# Patient Record
Sex: Male | Born: 1965 | Race: Black or African American | Hispanic: No | Marital: Married | Smoking: Never smoker
Health system: Southern US, Community
[De-identification: ages and names within clinical notes are randomized; demographics above are authoritative.]

## PROBLEM LIST (undated history)

## (undated) DIAGNOSIS — Z5189 Encounter for other specified aftercare: Secondary | ICD-10-CM

## (undated) DIAGNOSIS — L309 Dermatitis, unspecified: Secondary | ICD-10-CM

## (undated) DIAGNOSIS — T7840XA Allergy, unspecified, initial encounter: Secondary | ICD-10-CM

## (undated) DIAGNOSIS — IMO0002 Reserved for concepts with insufficient information to code with codable children: Secondary | ICD-10-CM

## (undated) DIAGNOSIS — R569 Unspecified convulsions: Secondary | ICD-10-CM

## (undated) DIAGNOSIS — E785 Hyperlipidemia, unspecified: Secondary | ICD-10-CM

## (undated) HISTORY — DX: Allergy, unspecified, initial encounter: T78.40XA

## (undated) HISTORY — DX: Reserved for concepts with insufficient information to code with codable children: IMO0002

## (undated) HISTORY — DX: Encounter for other specified aftercare: Z51.89

## (undated) HISTORY — DX: Dermatitis, unspecified: L30.9

## (undated) HISTORY — PX: CARPAL TUNNEL RELEASE: SHX101

## (undated) HISTORY — DX: Unspecified convulsions: R56.9

## (undated) HISTORY — PX: FOOT SURGERY: SHX648

## (undated) HISTORY — DX: Hyperlipidemia, unspecified: E78.5

---

## 2004-08-10 ENCOUNTER — Encounter: Admission: RE | Admit: 2004-08-10 | Discharge: 2004-08-10 | Payer: Self-pay | Admitting: Orthopedic Surgery

## 2004-10-09 ENCOUNTER — Ambulatory Visit (HOSPITAL_BASED_OUTPATIENT_CLINIC_OR_DEPARTMENT_OTHER): Admission: RE | Admit: 2004-10-09 | Discharge: 2004-10-09 | Payer: Self-pay | Admitting: Orthopedic Surgery

## 2004-10-09 ENCOUNTER — Ambulatory Visit (HOSPITAL_COMMUNITY): Admission: RE | Admit: 2004-10-09 | Discharge: 2004-10-09 | Payer: Self-pay | Admitting: Orthopedic Surgery

## 2006-11-26 ENCOUNTER — Encounter
Admission: RE | Admit: 2006-11-26 | Discharge: 2006-11-26 | Payer: Self-pay | Admitting: Physical Medicine and Rehabilitation

## 2010-04-02 ENCOUNTER — Encounter: Payer: Self-pay | Admitting: Physical Medicine and Rehabilitation

## 2010-07-27 NOTE — Op Note (Signed)
Nathan Boyle, Nathan Boyle             ACCOUNT NO.:  [id number]   MEDICAL RECORD NO.:  [id number]          PATIENT TYPE:  AMB   LOCATION:  [facility]SC                          FACILITY:  [facility]   PHYSICIAN:  Katy Fitch. Sypher, M.D. DATE OF BIRTH:  [birth year]   DATE OF PROCEDURE:  10/09/2004  DATE OF DISCHARGE:                                 OPERATIVE REPORT   PREOPERATIVE DIAGNOSIS:  Internal derangement of right wrist with  preoperative MRI suggesting scapholunate interosseous ligament tear and  clinical examination revealing instability of sixth dorsal compartment and  ulnar-sided wrist pain consistent with a peripheral triangular  fibrocartilage tear.   POSTOPERATIVE DIAGNOSIS:  Large ulnar and dorsal peripheral triangular  fibrocartilage tear with instability of triangular fibrocartilage and sixth  dorsal compartment and identification of the intact, but patulous  scapholunate interosseous ligament.   OPERATION:  1.  Examination of right wrist under anesthesia demonstrating a negative      Watson's maneuver and no sign of scapholunate dissociative instability.  2.  Diagnostic arthroscopy, right wrist, revealing a large peripheral      triangular fibrocartilage tear which is probably the genesis of the      sixth dorsal compartment instability and incidental synovitis and      fragments of cartilage noted impinging in the dorsal ulnocarpal joint      which were subsequently debrided.   OPERATING SURGEON:  Josephine Igo, M.D.   ASSISTANT:  Molly Maduro Dasnoit PA-C.   ANESTHESIA:  General by LMA.   SUPERVISING ANESTHESIOLOGIST:  Janetta Hora. Gelene Mink, M.D.   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 Gean Birchwood for evaluation and management of chronic ulnar-  sided right wrist pain.   Nathan Boyle had sustained an injury to his wrist while lifting a heavy tire  in the spring of 2006.  He noted a popping and grinding sensation when he  would  flex and extend his wrist in ulnar deviation or during episodes of  pronation and supination.  He sought a consultation with Dr. Turner Daniels and was  thought to have internal derangement.  He was referred for an MRI, which was  completed at the [facility] on August 10, 2004.  This was  interpreted by Dr. Maricela Curet to reveal a complete avulsion of the  scapholunate interosseous ligament.   Dr. Turner Daniels was kind enough to refer Nathan Boyle for an extremity orthopedic  consult.   On clinical examination in August 29, 2004, Nathan Boyle did not demonstrate  signs of dissociative carpal instability, specifically he had a negative  Watson's maneuver.  He did, however, have signs of instability of his sixth  dorsal compartment and ulnar-sided pain consistent with a peripheral  triangular fibrocartilage tear.   We recommended diagnostic arthroscopy, anticipating possible debridement of  his scapholunate interosseous ligament and possible stabilization of the  sixth dorsal compartment.   After informed consent, he is brought to the operating room at this time.   Preoperatively, he was advised that we could not repair scapholunate  interosseous ligaments and had limited abilities to repair the triangular  fibrocartilage.   PROCEDURE:  Nathan Boyle was brought to the operating room and placed in  a supine position upon the operating table.   Following anesthesia consultation by Dr. Gelene Mink, general anesthesia by  LMA technique was induced.   The right arm was prepped with Betadine soaping solution and sterilely  draped.  A pneumatic tourniquet was applied to the proximal brachium.  One  gram of Ancef was administered as an IV prophylactic antibiotic followed by  exsanguination of the right arm with an Esmarch bandage and inflation of the  arterial tourniquet to 240 mmHg due to mild systolic hypertension.   Procedure commenced with a careful Watson's maneuver examination of the  wrist.   There were absolutely no signs of dissociative instability of the  scapholunate interosseous ligament.   The wrist was then distracted by placement of the index and long fingers in  fingertraps and use of the traction tower designed for wrist arthroscopy to  apply countertraction on the forearm at 10 pounds of traction application.   The wrist was sounded with an 18-gauge needle, distended with sterile saline  and instrumented through the 3/4 dorsal portal with a blunt trocar.  Diagnostic arthroscopy revealed synovitis on the ulnar aspect of the wrist,  obscuring a large peripheral triangular fibrocartilage tear.  The prestyloid  recess was normal.  The lunotriquetral interosseous ligament was normal.  The hyaline and articular cartilage surfaces of the triquetrum, lunate and  scaphoid were normal.  The scapholunate interosseous ligament was intact,  but patulous.  The hyaline cartilage on the distal radius was normal and  scaphoid and lunate facets and t he volar radiocarpal and ulnocarpal  ligaments were intact.   It appeared that the genesis of his complaints was a peripheral triangular  fibrocartilage tear.   A suction shaver was placed through the 6R portal and used to debride the  synovitis and fragments of loose dorsal triangular fibrocartilage and dorsal  capsular tissues.   A Tuohy needle was used to place 2 mattress sutures of 2-0 FiberWire to  repair the peripheral triangular fibrocartilage.  A curvilinear incision was  fashioned to identify the dorsoulnar sensory branch and the sixth dorsal  compartment was carefully dissected to retract the dorsoulnar sensory branch  and to protect the neurovascular structures prior to tensioning the  triangular fibrocartilage repair.  It appeared that with tying the mattress  sutures with preparing the fibrocartilage, we also increased the tension in  the floor of the sixth dorsal compartment.  This should in part stabilize the sixth  dorsal compartment.   I elected not to perform retinacular repair.   The sutures were tightened under direct vision, substantially decreasing the  laxity of the dorsal radioulnar ligament.   The repair was documented with digital photography followed by removal of  the arthroscopic equipment.   The curvilinear incision used to expose the sixth dorsal compartment and  dorsoulnar sensory branch was closed with intradermal 3-0 Prolene and the  portals were closed with Steri-Strips. There were no apparent complications.   Nathan Boyle was maintained in supination during repair of the wounds and was  subsequently immobilized in a voluminous gauze dressing with dorsal and  palmar splints, maintaining the forearm in full supination and the elbow at  90 degrees of flexion.   There were no apparent complications.   He was transferred to the recovery room with stable signs.  He will be  discharged home with prescriptions for Dilaudid 2 mg one p.o. q.4-6  h.  p.r.n. pain, 30 tablets without refill, also Motrin 600 mg one p.o. q.6 h.  p.r.n. pain with food, 30 tablets with 1 refill, and Levaquin 500 mg one  p.o. daily x4 days as a prophylactic antibiotic.       RVS/MEDQ  D:  10/09/2004  T:  10/10/2004  Job:  [id number]

## 2013-03-01 ENCOUNTER — Encounter: Payer: Self-pay | Admitting: Family

## 2013-03-01 ENCOUNTER — Ambulatory Visit (INDEPENDENT_AMBULATORY_CARE_PROVIDER_SITE_OTHER): Payer: BC Managed Care – PPO | Admitting: Family

## 2013-03-01 VITALS — BP 126/78 | HR 83 | Ht 70.5 in | Wt 232.0 lb

## 2013-03-01 DIAGNOSIS — J309 Allergic rhinitis, unspecified: Secondary | ICD-10-CM

## 2013-03-01 DIAGNOSIS — J019 Acute sinusitis, unspecified: Secondary | ICD-10-CM

## 2013-03-01 MED ORDER — FLUTICASONE PROPIONATE 50 MCG/ACT NA SUSP: 2.0000 | Freq: Every day | NASAL | Status: DC

## 2013-03-01 MED ORDER — AZITHROMYCIN 250 MG PO TABS: ORAL_TABLET | ORAL | Status: DC

## 2013-03-01 NOTE — Progress Notes (Signed)
   Subjective:    Patient ID: Nathan Boyle, male    DOB: 1965-07-08, 47 y.o.   MRN: [id number]  HPI 47 year old African American male, nonsmoker is in today with complaints of sinus pressure, pain, and congestion x1 week now worsening. Has been taking TheraFlu, Benadryl, ibuprofen, and using an cough drops with no relief.    Review of Systems  Constitutional: Negative.   HENT: Positive for congestion, postnasal drip and sinus pressure.   Respiratory: Negative.   Cardiovascular: Negative.   Gastrointestinal: Negative.   Endocrine: Negative.   Genitourinary: Negative.   Musculoskeletal: Negative.   Skin: Negative.   Allergic/Immunologic: Negative.   Neurological: Negative.   Hematological: Negative.   Psychiatric/Behavioral: Negative.    Past Medical History  Diagnosis Date  . Seizures     at age 47  . Ulcer     30 yrs ago    History   Social History  . Marital Status: Married    Spouse Name: N/A    Number of Children: N/A  . Years of Education: N/A   Occupational History  . Not on file.   Social History Main Topics  . Smoking status: Never Smoker   . Smokeless tobacco: Not on file  . Alcohol Use: No  . Drug Use: No  . Sexual Activity: Not on file   Other Topics Concern  . Not on file   Social History Narrative  . No narrative on file    History reviewed. No pertinent past surgical history.  Family History  Problem Relation Age of Onset  . Hyperlipidemia Mother   . Stroke Mother   . Arthritis Sister   . Stroke Maternal Grandmother     Allergies  Allergen Reactions  . Penicillins     No current outpatient prescriptions on file prior to visit.   No current facility-administered medications on file prior to visit.    BP 126/78  Pulse 83  Ht 5' 10.5" (1.791 m)  Wt 232 lb (105.235 kg)  BMI 32.81 kg/m2chart    Objective:   Physical Exam  Constitutional: He is oriented to person, place, and time. He appears well-developed and  well-nourished.  HENT:  Right Ear: External ear normal.  Left Ear: External ear normal.  Nose: Nose normal.  Mouth/Throat: Oropharynx is clear and moist.  Neck: Normal range of motion. Neck supple.  Cardiovascular: Normal rate, regular rhythm and normal heart sounds.   Pulmonary/Chest: Effort normal and breath sounds normal.  Abdominal: Soft. Bowel sounds are normal.  Musculoskeletal: Normal range of motion.  Neurological: He is alert and oriented to person, place, and time.  Skin: Skin is warm and dry.  Psychiatric: He has a normal mood and affect.          Assessment & Plan:  Assessment: 1. Acute sinusitis 2. Allergic rhinitis  Plan: Flonase 2 sprays in his nausea once a day. Z-Pak as directed. Patient apply office if symptoms worsen or persist. Recheck as scheduled, and as needed.

## 2013-03-01 NOTE — Patient Instructions (Signed)

## 2013-07-05 ENCOUNTER — Other Ambulatory Visit: Payer: Self-pay

## 2013-07-05 MED ORDER — FEXOFENADINE-PSEUDOEPHED ER 60-120 MG PO TB12: 1.0000 | ORAL_TABLET | Freq: Two times a day (BID) | ORAL | Status: AC

## 2013-10-25 ENCOUNTER — Telehealth: Payer: Self-pay | Admitting: Family

## 2013-10-25 ENCOUNTER — Encounter: Payer: Self-pay | Admitting: Family

## 2013-10-25 ENCOUNTER — Ambulatory Visit (INDEPENDENT_AMBULATORY_CARE_PROVIDER_SITE_OTHER): Payer: 59 | Admitting: Family

## 2013-10-25 VITALS — BP 140/94 | HR 74 | Temp 98.0°F | Ht 70.5 in | Wt 219.0 lb

## 2013-10-25 DIAGNOSIS — Z125 Encounter for screening for malignant neoplasm of prostate: Secondary | ICD-10-CM

## 2013-10-25 DIAGNOSIS — R209 Unspecified disturbances of skin sensation: Secondary | ICD-10-CM

## 2013-10-25 DIAGNOSIS — R5381 Other malaise: Secondary | ICD-10-CM

## 2013-10-25 DIAGNOSIS — Z0000 Encounter for general adult medical examination without abnormal findings: Secondary | ICD-10-CM

## 2013-10-25 DIAGNOSIS — R202 Paresthesia of skin: Secondary | ICD-10-CM

## 2013-10-25 DIAGNOSIS — R5383 Other fatigue: Secondary | ICD-10-CM

## 2013-10-25 LAB — COMPREHENSIVE METABOLIC PANEL
ALBUMIN: 4.3 g/dL (ref 3.5–5.2)
ALK PHOS: 71 U/L (ref 39–117)
ALT: 19 U/L (ref 0–53)
AST: 19 U/L (ref 0–37)
BILIRUBIN TOTAL: 1.4 mg/dL — AB (ref 0.2–1.2)
BUN: 10 mg/dL (ref 6–23)
CO2: 28 mEq/L (ref 19–32)
CREATININE: 0.9 mg/dL (ref 0.4–1.5)
Calcium: 9.6 mg/dL (ref 8.4–10.5)
Chloride: 106 mEq/L (ref 96–112)
GFR: 112.11 mL/min (ref >60.00)
Glucose, Bld: 97 mg/dL (ref 70–99)
Potassium: 3.9 mEq/L (ref 3.5–5.1)
SODIUM: 141 meq/L (ref 135–145)
TOTAL PROTEIN: 7.2 g/dL (ref 6.0–8.3)

## 2013-10-25 LAB — CBC WITH DIFFERENTIAL/PLATELET
BASOS PCT: 0.5 % (ref 0.0–3.0)
Basophils Absolute: 0 10*3/uL (ref 0.0–0.1)
EOS ABS: 0.1 10*3/uL (ref 0.0–0.7)
EOS PCT: 1.8 % (ref 0.0–5.0)
HCT: 41 % (ref 39.0–52.0)
Hemoglobin: 13.5 g/dL (ref 13.0–17.0)
LYMPHS PCT: 34.6 % (ref 12.0–46.0)
Lymphs Abs: 1.4 10*3/uL (ref 0.7–4.0)
MCHC: 33 g/dL (ref 30.0–36.0)
MCV: 88.3 fl (ref 78.0–100.0)
MONO ABS: 0.5 10*3/uL (ref 0.1–1.0)
MONOS PCT: 11.1 % (ref 3.0–12.0)
NEUTROS ABS: 2.1 10*3/uL (ref 1.4–7.7)
Neutrophils Relative %: 52 % (ref 43.0–77.0)
PLATELETS: 186 10*3/uL (ref 150.0–400.0)
RBC: 4.64 Mil/uL (ref 4.22–5.81)
RDW: 12.3 % (ref 11.5–15.5)
WBC: 4.1 10*3/uL (ref 4.0–10.5)

## 2013-10-25 LAB — POCT URINALYSIS DIPSTICK
Bilirubin, UA: NEGATIVE
GLUCOSE UA: NEGATIVE
KETONES UA: NEGATIVE
Leukocytes, UA: NEGATIVE
NITRITE UA: NEGATIVE
PH UA: 6
Protein, UA: NEGATIVE
RBC UA: NEGATIVE
Spec Grav, UA: 1.01
UROBILINOGEN UA: 0.2

## 2013-10-25 LAB — LIPID PANEL
CHOL/HDL RATIO: 4
Cholesterol: 194 mg/dL (ref 0–200)
HDL: 46.8 mg/dL (ref >39.00)
LDL CALC: 123 mg/dL — AB (ref 0–99)
NonHDL: 147.2
TRIGLYCERIDES: 121 mg/dL (ref 0.0–149.0)
VLDL: 24.2 mg/dL (ref 0.0–40.0)

## 2013-10-25 LAB — PSA: PSA: 0.53 ng/mL (ref 0.10–4.00)

## 2013-10-25 LAB — TESTOSTERONE: TESTOSTERONE: 336.05 ng/dL (ref 300.00–890.00)

## 2013-10-25 LAB — TSH: TSH: 0.84 u[IU]/mL (ref 0.35–4.50)

## 2013-10-25 MED ORDER — TRIAMCINOLONE 0.1 % CREAM:EUCERIN CREAM 1:1
1.0000 | TOPICAL_CREAM | Freq: Two times a day (BID) | CUTANEOUS | Status: DC
Start: 2013-10-25 — End: 2015-02-06

## 2013-10-25 NOTE — Progress Notes (Signed)
Subjective:    Patient ID: Nathan Boyle, male    DOB: 11-Nov-1965, 48 y.o.   MRN: [id number]  HPI 48 year old AAM, nonsmoker, is in today with c/o fatigue, numbness and tingling in his fingers and toes x3-4 months. The symptoms tend to be worse with increased stress. Reports the symptoms come and go in. Denies any relief from anything in particular.   Review of Systems  Constitutional: Positive for fatigue.  HENT: Negative.   Respiratory: Negative.   Cardiovascular: Negative.   Gastrointestinal: Negative.   Endocrine: Negative.   Genitourinary: Negative.   Musculoskeletal: Negative.   Skin: Negative.   Allergic/Immunologic: Negative.   Neurological: Positive for numbness.       Numbness and tingling in fingers and toes.   Hematological: Negative.   Psychiatric/Behavioral: Negative.    Past Medical History  Diagnosis Date  . Seizures     at age 48  . Ulcer     30 yrs ago    History   Social History  . Marital Status: Married    Spouse Name: N/A    Number of Children: N/A  . Years of Education: N/A   Occupational History  . Not on file.   Social History Main Topics  . Smoking status: Never Smoker   . Smokeless tobacco: Not on file  . Alcohol Use: No  . Drug Use: No  . Sexual Activity: Not on file   Other Topics Concern  . Not on file   Social History Narrative  . No narrative on file    History reviewed. No pertinent past surgical history.  Family History  Problem Relation Age of Onset  . Hyperlipidemia Mother   . Stroke Mother   . Arthritis Sister   . Stroke Maternal Grandmother     Allergies  Allergen Reactions  . Penicillins     Current Outpatient Prescriptions on File Prior to Visit  Medication Sig Dispense Refill  . fexofenadine-pseudoephedrine (ALLEGRA-D 12 HOUR) 60-120 MG per tablet Take 1 tablet by mouth 2 (two) times daily.  180 tablet  0   No current facility-administered medications on file prior to visit.    BP 140/94   Pulse 74  Temp(Src) 98 F (36.7 C) (Oral)  Ht 5' 10.5" (1.791 m)  Wt 219 lb (99.338 kg)  BMI 30.97 kg/m2  SpO2 98%chart    Objective:   Physical Exam  Constitutional: He is oriented to person, place, and time. He appears well-developed and well-nourished.  HENT:  Right Ear: External ear normal.  Left Ear: External ear normal.  Nose: Nose normal.  Mouth/Throat: Oropharynx is clear and moist.  Neck: Normal range of motion. Neck supple.  Cardiovascular: Normal rate, regular rhythm and normal heart sounds.   Pulmonary/Chest: Effort normal and breath sounds normal.  Abdominal: Soft. Bowel sounds are normal.  Musculoskeletal: Normal range of motion.  Neurological: He is alert and oriented to person, place, and time.  Skin: Skin is warm and dry.  Psychiatric: He has a normal mood and affect.          Assessment & Plan:  Mandela was seen today for fatigue and tingling.  Diagnoses and associated orders for this visit:  Tingling in extremities - CMP - CBC with Differential - TSH - Vitamin D, 1,25-dihydroxy - PSA - POC Urinalysis Dipstick - Testosterone - Lipid Panel  Other malaise and fatigue - CMP - CBC with Differential - TSH - Vitamin D, 1,25-dihydroxy - PSA - POC Urinalysis Dipstick -  Testosterone - Lipid Panel  Screening PSA (prostate specific antigen) - CMP - CBC with Differential - TSH - Vitamin D, 1,25-dihydroxy - PSA - POC Urinalysis Dipstick - Testosterone - Lipid Panel - POC Urinalysis Dipstick  Preventative health care - CMP - CBC with Differential - TSH - Vitamin D, 1,25-dihydroxy - PSA - POC Urinalysis Dipstick - Testosterone - Lipid Panel - POC Urinalysis Dipstick  Other Orders - Triamcinolone Acetonide (TRIAMCINOLONE 0.1 % CREAM : EUCERIN) CREA; Apply 1 application topically 2 (two) times daily.   Call the office with any questions or concerns. Return for CPX. Will notify patient pending results.

## 2013-10-25 NOTE — Telephone Encounter (Signed)
Obtain Eucerin OTC and hydrocortisone cream. Mix together 1:1 at home.

## 2013-10-25 NOTE — Progress Notes (Signed)
Pre visit review using our clinic review tool, if applicable. No additional management support is needed unless otherwise documented below in the visit note. 

## 2013-10-25 NOTE — Telephone Encounter (Signed)
I called to submit PA for Triamcinolone Acetonide (TRIAMCINOLONE 0.1 % CREAM : EUCERIN) CREA and was advised the pt's plan does not cover Compound Creams.

## 2013-10-25 NOTE — Patient Instructions (Signed)
Eczema Eczema, also called atopic dermatitis, is a skin disorder that causes inflammation of the skin. It causes a red rash and dry, scaly skin. The skin becomes very itchy. Eczema is generally worse during the cooler winter months and often improves with the warmth of summer. Eczema usually starts showing signs in infancy. Some children outgrow eczema, but it may last through adulthood.  CAUSES  The exact cause of eczema is not known, but it appears to run in families. People with eczema often have a family history of eczema, allergies, asthma, or hay fever. Eczema is not contagious. Flare-ups of the condition may be caused by:   Contact with something you are sensitive or allergic to.   Stress. SIGNS AND SYMPTOMS  Dry, scaly skin.   Red, itchy rash.   Itchiness. This may occur before the skin rash and may be very intense.  DIAGNOSIS  The diagnosis of eczema is usually made based on symptoms and medical history. TREATMENT  Eczema cannot be cured, but symptoms usually can be controlled with treatment and other strategies. A treatment plan might include:  Controlling the itching and scratching.   Use over-the-counter antihistamines as directed for itching. This is especially useful at night when the itching tends to be worse.   Use over-the-counter steroid creams as directed for itching.   Avoid scratching. Scratching makes the rash and itching worse. It may also result in a skin infection (impetigo) due to a break in the skin caused by scratching.   Keeping the skin well moisturized with creams every day. This will seal in moisture and help prevent dryness. Lotions that contain alcohol and water should be avoided because they can dry the skin.   Limiting exposure to things that you are sensitive or allergic to (allergens).   Recognizing situations that cause stress.   Developing a plan to manage stress.  HOME CARE INSTRUCTIONS   Only take over-the-counter or  prescription medicines as directed by your health care provider.   Do not use anything on the skin without checking with your health care provider.   Keep baths or showers short (5 minutes) in warm (not hot) water. Use mild cleansers for bathing. These should be unscented. You may add nonperfumed bath oil to the bath water. It is best to avoid soap and bubble bath.   Immediately after a bath or shower, when the skin is still damp, apply a moisturizing ointment to the entire body. This ointment should be a petroleum ointment. This will seal in moisture and help prevent dryness. The thicker the ointment, the better. These should be unscented.   Keep fingernails cut short. Children with eczema may need to wear soft gloves or mittens at night after applying an ointment.   Dress in clothes made of cotton or cotton blends. Dress lightly, because heat increases itching.   A child with eczema should stay away from anyone with fever blisters or cold sores. The virus that causes fever blisters (herpes simplex) can cause a serious skin infection in children with eczema. SEEK MEDICAL CARE IF:   Your itching interferes with sleep.   Your rash gets worse or is not better within 1 week after starting treatment.   You see pus or soft yellow scabs in the rash area.   You have a fever.   You have a rash flare-up after contact with someone who has fever blisters.  Document Released: 02/23/2000 Document Revised: 12/16/2012 Document Reviewed: 09/28/2012 [employer] Patient Information 2015 [employer]. This information   is not intended to replace advice given to you by your health care provider. Make sure you discuss any questions you have with your health care provider.  

## 2013-10-25 NOTE — Telephone Encounter (Signed)
Please advise 

## 2013-10-26 NOTE — Telephone Encounter (Signed)
Left message to advise pt of Padonda's note. Advised to call back with questions or concerns

## 2013-11-01 ENCOUNTER — Telehealth: Payer: Self-pay | Admitting: Family

## 2013-11-01 NOTE — Telephone Encounter (Signed)
Pt's wife aware vit d results have not been released

## 2013-11-01 NOTE — Telephone Encounter (Signed)
Pt would results of labs for vit d.

## 2013-11-17 ENCOUNTER — Telehealth: Payer: Self-pay | Admitting: Family

## 2013-11-17 NOTE — Telephone Encounter (Signed)
I submitted PA request for Triamcinolone Acetonide (TRIAMCINOLONE 0.1 % CREAM : EUCERIN) CREA and received a fax back from pt's plan stating the requested product is a plan exclusion and a PA could not be submitted.

## 2013-11-17 NOTE — Telephone Encounter (Signed)
Noted  

## 2013-11-29 ENCOUNTER — Encounter: Payer: 59 | Admitting: Family

## 2013-12-29 ENCOUNTER — Ambulatory Visit (INDEPENDENT_AMBULATORY_CARE_PROVIDER_SITE_OTHER): Payer: 59 | Admitting: Family

## 2013-12-29 ENCOUNTER — Encounter: Payer: Self-pay | Admitting: Family

## 2013-12-29 VITALS — BP 120/84 | HR 90 | Ht 70.25 in | Wt 212.6 lb

## 2013-12-29 DIAGNOSIS — Z0000 Encounter for general adult medical examination without abnormal findings: Secondary | ICD-10-CM

## 2013-12-29 DIAGNOSIS — Z23 Encounter for immunization: Secondary | ICD-10-CM

## 2013-12-29 DIAGNOSIS — E785 Hyperlipidemia, unspecified: Secondary | ICD-10-CM | POA: Insufficient documentation

## 2013-12-29 DIAGNOSIS — E7800 Pure hypercholesterolemia, unspecified: Secondary | ICD-10-CM | POA: Insufficient documentation

## 2013-12-29 NOTE — Progress Notes (Signed)
Subjective:    Patient ID: Nathan Boyle, male    DOB: 01-19-1966, 48 y.o.   MRN: [id number]  HPI 48 year old [place], nonsmoker is in today for complete physical exam. Denies any concerns.  Patient presents for yearly preventative medicine examination.  All immunizations and health maintenance protocols were reviewed with the patient and needed orders were placed.  Appropriate screening laboratory values were ordered for the patient including screening of hyperlipidemia, renal function and hepatic function. If indicated by BPH, a PSA was ordered.  Medication reconciliation,  past medical history, social history, problem list and allergies were reviewed in detail with the patient     Review of Systems  Constitutional: Negative.   HENT: Negative.   Eyes: Negative.   Respiratory: Negative.   Cardiovascular: Negative.   Gastrointestinal: Negative.   Endocrine: Negative.   Genitourinary: Negative.   Musculoskeletal: Negative.   Skin: Negative.   Allergic/Immunologic: Negative.   Neurological: Negative.   Hematological: Negative.   Psychiatric/Behavioral: Negative.    Past Medical History  Diagnosis Date  . Seizures     at age 48  . Ulcer     30 yrs ago    History   Social History  . Marital Status: Married    Spouse Name: N/A    Number of Children: N/A  . Years of Education: N/A   Occupational History  . Not on file.   Social History Main Topics  . Smoking status: Never Smoker   . Smokeless tobacco: Not on file  . Alcohol Use: No  . Drug Use: No  . Sexual Activity: Not on file   Other Topics Concern  . Not on file   Social History Narrative  . No narrative on file    History reviewed. No pertinent past surgical history.  Family History  Problem Relation Age of Onset  . Hyperlipidemia Mother   . Stroke Mother   . Arthritis Sister   . Stroke Maternal Grandmother     Allergies  Allergen Reactions  . Penicillins      Current Outpatient Prescriptions on File Prior to Visit  Medication Sig Dispense Refill  . fexofenadine-pseudoephedrine (ALLEGRA-D 12 HOUR) 60-120 MG per tablet Take 1 tablet by mouth 2 (two) times daily.  180 tablet  0  . Multiple Vitamin (MULTIVITAMIN) tablet Take 1 tablet by mouth daily.      . Triamcinolone Acetonide (TRIAMCINOLONE 0.1 % CREAM : EUCERIN) CREA Apply 1 application topically 2 (two) times daily.  1 each  0   No current facility-administered medications on file prior to visit.    BP 120/84  Pulse 90  Ht 5' 10.25" (1.784 m)  Wt 212 lb 9.6 oz (96.435 kg)  BMI 30.30 kg/m2chart    Objective:   Physical Exam  Constitutional: He is oriented to person, place, and time. He appears well-developed and well-nourished.  HENT:  Right Ear: External ear normal.  Left Ear: External ear normal.  Nose: Nose normal.  Mouth/Throat: Oropharynx is clear and moist.  Neck: Normal range of motion. Neck supple.  Cardiovascular: Normal rate, regular rhythm and normal heart sounds.   Pulmonary/Chest: Effort normal and breath sounds normal.  Abdominal: Soft. Bowel sounds are normal.  Musculoskeletal: Normal range of motion.  Neurological: He is alert and oriented to person, place, and time.  Skin: Skin is warm and dry.  Psychiatric: He has a normal mood and affect.          Assessment & Plan:  Nathan Boyle  was seen today for annual exam.  Diagnoses and associated orders for this visit:  Preventative health care - EKG 12-Lead - Ambulatory referral to Gastroenterology  Hypercholesteremia - EKG 12-Lead - Lipid Panel; Future  Encounter for immunization   Call the office with . With any questions or concerns. Recheck in one month fasting cholesterol to reassess concerns

## 2013-12-29 NOTE — Patient Instructions (Signed)
Fat and Cholesterol Control Diet Fat and cholesterol levels in your blood and organs are influenced by your diet. High levels of fat and cholesterol may lead to diseases of the heart, small and large blood vessels, gallbladder, liver, and pancreas. CONTROLLING FAT AND CHOLESTEROL WITH DIET Although exercise and lifestyle factors are important, your diet is key. That is because certain foods are known to raise cholesterol and others to lower it. The goal is to balance foods for their effect on cholesterol and more importantly, to replace saturated and trans fat with other types of fat, such as monounsaturated fat, polyunsaturated fat, and omega-3 fatty acids. On average, a person should consume no more than 15 to 17 g of saturated fat daily. Saturated and trans fats are considered "bad" fats, and they will raise LDL cholesterol. Saturated fats are primarily found in animal products such as meats, butter, and cream. However, that does not mean you need to give up all your favorite foods. Today, there are good tasting, low-fat, low-cholesterol substitutes for most of the things you like to eat. Choose low-fat or nonfat alternatives. Choose round or loin cuts of red meat. These types of cuts are lowest in fat and cholesterol. Chicken (without the skin), fish, veal, and ground turkey breast are great choices. Eliminate fatty meats, such as hot dogs and salami. Even shellfish have little or no saturated fat. Have a 3 oz (85 g) portion when you eat lean meat, poultry, or fish. Trans fats are also called "partially hydrogenated oils." They are oils that have been scientifically manipulated so that they are solid at room temperature resulting in a longer shelf life and improved taste and texture of foods in which they are added. Trans fats are found in stick margarine, some tub margarines, cookies, crackers, and baked goods.  When baking and cooking, oils are a great substitute for butter. The monounsaturated oils are  especially beneficial since it is believed they lower LDL and raise HDL. The oils you should avoid entirely are saturated tropical oils, such as coconut and palm.  Remember to eat a lot from food groups that are naturally free of saturated and trans fat, including fish, fruit, vegetables, beans, grains (barley, rice, couscous, bulgur wheat), and pasta (without cream sauces).  IDENTIFYING FOODS THAT LOWER FAT AND CHOLESTEROL  Soluble fiber may lower your cholesterol. This type of fiber is found in fruits such as apples, vegetables such as broccoli, potatoes, and carrots, legumes such as beans, peas, and lentils, and grains such as barley. Foods fortified with plant sterols (phytosterol) may also lower cholesterol. You should eat at least 2 g per day of these foods for a cholesterol lowering effect.  Read package labels to identify low-saturated fats, trans fat free, and low-fat foods at the supermarket. Select cheeses that have only 2 to 3 g saturated fat per ounce. Use a heart-healthy tub margarine that is free of trans fats or partially hydrogenated oil. When buying baked goods (cookies, crackers), avoid partially hydrogenated oils. Breads and muffins should be made from whole grains (whole-wheat or whole oat flour, instead of "flour" or "enriched flour"). Buy non-creamy canned soups with reduced salt and no added fats.  FOOD PREPARATION TECHNIQUES  Never deep-fry. If you must fry, either stir-fry, which uses very little fat, or use non-stick cooking sprays. When possible, broil, bake, or roast meats, and steam vegetables. Instead of putting butter or margarine on vegetables, use lemon and herbs, applesauce, and cinnamon (for squash and sweet potatoes). Use nonfat   yogurt, salsa, and low-fat dressings for salads.  LOW-SATURATED FAT / LOW-FAT FOOD SUBSTITUTES Meats / Saturated Fat (g)  Avoid: Steak, marbled (3 oz/85 g) / 11 g  Choose: Steak, lean (3 oz/85 g) / 4 g  Avoid: Hamburger (3 oz/85 g) / 7  g  Choose: Hamburger, lean (3 oz/85 g) / 5 g  Avoid: Ham (3 oz/85 g) / 6 g  Choose: Ham, lean cut (3 oz/85 g) / 2.4 g  Avoid: Chicken, with skin, dark meat (3 oz/85 g) / 4 g  Choose: Chicken, skin removed, dark meat (3 oz/85 g) / 2 g  Avoid: Chicken, with skin, light meat (3 oz/85 g) / 2.5 g  Choose: Chicken, skin removed, light meat (3 oz/85 g) / 1 g Dairy / Saturated Fat (g)  Avoid: Whole milk (1 cup) / 5 g  Choose: Low-fat milk, 2% (1 cup) / 3 g  Choose: Low-fat milk, 1% (1 cup) / 1.5 g  Choose: Skim milk (1 cup) / 0.3 g  Avoid: Hard cheese (1 oz/28 g) / 6 g  Choose: Skim milk cheese (1 oz/28 g) / 2 to 3 g  Avoid: Cottage cheese, 4% fat (1 cup) / 6.5 g  Choose: Low-fat cottage cheese, 1% fat (1 cup) / 1.5 g  Avoid: Ice cream (1 cup) / 9 g  Choose: Sherbet (1 cup) / 2.5 g  Choose: Nonfat frozen yogurt (1 cup) / 0.3 g  Choose: Frozen fruit bar / trace  Avoid: Whipped cream (1 tbs) / 3.5 g  Choose: Nondairy whipped topping (1 tbs) / 1 g Condiments / Saturated Fat (g)  Avoid: Mayonnaise (1 tbs) / 2 g  Choose: Low-fat mayonnaise (1 tbs) / 1 g  Avoid: Butter (1 tbs) / 7 g  Choose: Extra light margarine (1 tbs) / 1 g  Avoid: Coconut oil (1 tbs) / 11.8 g  Choose: Olive oil (1 tbs) / 1.8 g  Choose: Corn oil (1 tbs) / 1.7 g  Choose: Safflower oil (1 tbs) / 1.2 g  Choose: Sunflower oil (1 tbs) / 1.4 g  Choose: Soybean oil (1 tbs) / 2.4 g  Choose: Canola oil (1 tbs) / 1 g Document Released: 02/25/2005 Document Revised: 06/22/2012 Document Reviewed: 05/26/2013 Exit[employer] Patient Information 2015 [employer]. This information is not intended to replace advice given to you by your health care provider. Make sure you discuss any questions you have with your health care provider.  

## 2013-12-29 NOTE — Progress Notes (Signed)
Pre visit review using our clinic review tool, if applicable. No additional management support is needed unless otherwise documented below in the visit note. 

## 2013-12-31 ENCOUNTER — Encounter: Payer: Self-pay | Admitting: Internal Medicine

## 2014-01-24 ENCOUNTER — Other Ambulatory Visit (INDEPENDENT_AMBULATORY_CARE_PROVIDER_SITE_OTHER): Payer: 59

## 2014-01-24 DIAGNOSIS — E7800 Pure hypercholesterolemia, unspecified: Secondary | ICD-10-CM

## 2014-01-24 LAB — LIPID PANEL
CHOL/HDL RATIO: 4
Cholesterol: 205 mg/dL — ABNORMAL HIGH (ref 0–200)
HDL: 47.1 mg/dL (ref >39.00)
LDL CALC: 147 mg/dL — AB (ref 0–99)
NonHDL: 157.9
TRIGLYCERIDES: 57 mg/dL (ref 0.0–149.0)
VLDL: 11.4 mg/dL (ref 0.0–40.0)

## 2014-01-31 ENCOUNTER — Other Ambulatory Visit: Payer: 59

## 2014-01-31 ENCOUNTER — Telehealth: Payer: Self-pay

## 2014-01-31 MED ORDER — SIMVASTATIN 10 MG PO TABS: 10.0000 mg | ORAL_TABLET | Freq: Every day | ORAL | Status: DC

## 2014-01-31 NOTE — Telephone Encounter (Signed)
See lab note.  

## 2014-02-14 ENCOUNTER — Ambulatory Visit (AMBULATORY_SURGERY_CENTER): Payer: Self-pay | Admitting: *Deleted

## 2014-02-14 VITALS — Ht 72.0 in | Wt 216.0 lb

## 2014-02-14 DIAGNOSIS — Z1211 Encounter for screening for malignant neoplasm of colon: Secondary | ICD-10-CM

## 2014-02-14 MED ORDER — MOVIPREP 100 G PO SOLR: 1.0000 | Freq: Once | ORAL | Status: DC

## 2014-02-14 NOTE — Progress Notes (Signed)
No egg or soy allergy. ewm No diet pills. No blood thinners. ewm No home 02 use. ewm No issues with past sedation. ewm emmi video to pt's e mail. ewm

## 2014-02-28 ENCOUNTER — Ambulatory Visit (AMBULATORY_SURGERY_CENTER): Payer: 59 | Admitting: Internal Medicine

## 2014-02-28 ENCOUNTER — Encounter: Payer: Self-pay | Admitting: Internal Medicine

## 2014-02-28 VITALS — BP 128/81 | HR 62 | Temp 96.8°F | Resp 18 | Ht 72.0 in | Wt 216.0 lb

## 2014-02-28 DIAGNOSIS — Z1211 Encounter for screening for malignant neoplasm of colon: Secondary | ICD-10-CM

## 2014-02-28 MED ORDER — SODIUM CHLORIDE 0.9 % IV SOLN: 500.0000 mL | INTRAVENOUS | Status: DC

## 2014-02-28 NOTE — Patient Instructions (Signed)
YOU HAD AN ENDOSCOPIC PROCEDURE TODAY AT THE [facility]: Refer to the procedure report that was given to you for any specific questions about what was found during the examination.  If the procedure report does not answer your questions, please call your gastroenterologist to clarify.  If you requested that your care partner not be given the details of your procedure findings, then the procedure report has been included in a sealed envelope for you to review at your convenience later.  YOU SHOULD EXPECT: Some feelings of bloating in the abdomen. Passage of more gas than usual.  Walking can help get rid of the air that was put into your GI tract during the procedure and reduce the bloating. If you had a lower endoscopy (such as a colonoscopy or flexible sigmoidoscopy) you may notice spotting of blood in your stool or on the toilet paper. If you underwent a bowel prep for your procedure, then you may not have a normal bowel movement for a few days.  DIET: Your first meal following the procedure should be a light meal and then it is ok to progress to your normal diet.  A half-sandwich or bowl of soup is an example of a good first meal.  Heavy or fried foods are harder to digest and may make you feel nauseous or bloated.  Likewise meals heavy in dairy and vegetables can cause extra gas to form and this can also increase the bloating.  Drink plenty of fluids but you should avoid alcoholic beverages for 24 hours.  ACTIVITY: Your care partner should take you home directly after the procedure.  You should plan to take it easy, moving slowly for the rest of the day.  You can resume normal activity the day after the procedure however you should NOT DRIVE or use heavy machinery for 24 hours (because of the sedation medicines used during the test).    SYMPTOMS TO REPORT IMMEDIATELY: A gastroenterologist can be reached at any hour.  During normal business hours, 8:30 AM to 5:00 PM Monday through Friday,  call [phone].  After hours and on weekends, please call the GI answering service at [phone] who will take a message and have the physician on call contact you.   Following lower endoscopy (colonoscopy or flexible sigmoidoscopy):  Excessive amounts of blood in the stool  Significant tenderness or worsening of abdominal pains  Swelling of the abdomen that is new, acute  Fever of 100F or higher    FOLLOW UP: If any biopsies were taken you will be contacted by phone or by letter within the next 1-3 weeks.  Call your gastroenterologist if you have not heard about the biopsies in 3 weeks.  Our staff will call the home number listed on your records the next business day following your procedure to check on you and address any questions or concerns that you may have at that time regarding the information given to you following your procedure. This is a courtesy call and so if there is no answer at the home number and we have not heard from you through the emergency physician on call, we will assume that you have returned to your regular daily activities without incident.  SIGNATURES/CONFIDENTIALITY: You and/or your care partner have signed paperwork which will be entered into your electronic medical record.  These signatures attest to the fact that that the information above on your After Visit Summary has been reviewed and is understood.  Full responsibility of the confidentiality   of this discharge information lies with you and/or your care-partner.     

## 2014-02-28 NOTE — Op Note (Signed)
[facility] [address] [place], [id number]   COLONOSCOPY PROCEDURE REPORT  PATIENT: [name], Nathan L  MR#: [id number] BIRTHDATE: [birth year] , 48  yrs. old GENDER: male ENDOSCOPIST: Beverley FiedlerJay M Rayven Hendrickson, MD REFERRED XB:[id number]:Padonda Orvan Falconerampbell, FNP-BC PROCEDURE DATE:  02/28/2014 PROCEDURE:   Colonoscopy, screening First Screening Colonoscopy - Avg.  risk and is 50 yrs.  old or older Yes.  Prior Negative Screening - Now for repeat screening. N/A  History of Adenoma - Now for follow-up colonoscopy & has been > or = to 3 yrs.  N/A  Polyps Removed Today? No.  Polyps Removed Today? No.  Recommend repeat exam, <10 yrs? Polyps Removed Today? No.  Recommend repeat exam, <10 yrs? No. ASA CLASS:   Class II INDICATIONS:average risk for colorectal cancer and first colonoscopy. MEDICATIONS: Monitored anesthesia care and Propofol 400 mg IV  DESCRIPTION OF PROCEDURE:   After the risks benefits and alternatives of the procedure were thoroughly explained, informed consent was obtained.  The digital rectal exam revealed no abnormalities of the rectum.   The LB [id number]  endoscope was introduced through the anus and advanced to the cecum, which was identified by both the appendix and ileocecal valve. No adverse events experienced.   The quality of the prep was fair clearing to good after copious irrigation and lavage, using MoviPrep  The instrument was then slowly withdrawn as the colon was fully examined.     COLON FINDINGS: A normal appearing cecum, ileocecal valve, and appendiceal orifice were identified.  The ascending, transverse, descending, sigmoid colon, and rectum appeared unremarkable. Retroflexed views revealed no abnormalities. The time to cecum=9 minutes 33 seconds.  Withdrawal time=16 minutes 44 seconds.  The scope was withdrawn and the procedure completed.  COMPLICATIONS: There were no immediate complications.  ENDOSCOPIC IMPRESSION: Normal  colonoscopy  RECOMMENDATIONS: You should continue to follow colorectal cancer screening guidelines for "routine risk" patients with a repeat colonoscopy in 10 years. There is no need for FOBT (stool) testing for at least 5 years.  eSigned:  Beverley FiedlerJay M Ariyanah Aguado, MD 02/28/2014 10:48 AM   cc: the Patient, PCP

## 2014-02-28 NOTE — Progress Notes (Signed)
A/ox3 pleased with MAC, report to Karen RN 

## 2014-03-01 ENCOUNTER — Telehealth: Payer: Self-pay | Admitting: *Deleted

## 2014-03-01 NOTE — Telephone Encounter (Signed)
  Follow up Call-  Call back number 02/28/2014  Post procedure Call Back phone  # [phone]  Permission to leave phone message Yes  comments spouse's number OK to speak with her     Patient questions:  Do you have a fever, pain , or abdominal swelling? No. Pain Score  0 *  Have you tolerated food without any problems? Yes.    Have you been able to return to your normal activities? Yes.    Do you have any questions about your discharge instructions: Diet   No. Medications  No. Follow up visit  No.  Do you have questions or concerns about your Care? No.  Actions: * If pain score is 4 or above: No action needed, pain <4.

## 2014-04-04 ENCOUNTER — Other Ambulatory Visit: Payer: 59

## 2014-05-25 ENCOUNTER — Other Ambulatory Visit: Payer: Self-pay | Admitting: Family

## 2014-08-30 ENCOUNTER — Other Ambulatory Visit: Payer: Self-pay | Admitting: Family

## 2014-11-08 ENCOUNTER — Ambulatory Visit: Payer: Self-pay | Admitting: Family Medicine

## 2014-11-22 ENCOUNTER — Encounter: Payer: Self-pay | Admitting: Family Medicine

## 2015-01-05 ENCOUNTER — Ambulatory Visit: Payer: Self-pay | Admitting: Family Medicine

## 2015-02-06 ENCOUNTER — Ambulatory Visit (INDEPENDENT_AMBULATORY_CARE_PROVIDER_SITE_OTHER): Payer: 59 | Admitting: Family Medicine

## 2015-02-06 ENCOUNTER — Telehealth: Payer: Self-pay | Admitting: Family Medicine

## 2015-02-06 ENCOUNTER — Other Ambulatory Visit: Payer: Self-pay | Admitting: Family Medicine

## 2015-02-06 ENCOUNTER — Other Ambulatory Visit: Payer: Self-pay | Admitting: *Deleted

## 2015-02-06 ENCOUNTER — Encounter: Payer: Self-pay | Admitting: Family Medicine

## 2015-02-06 VITALS — BP 120/82 | HR 80 | Temp 97.7°F | Ht 72.0 in | Wt 229.2 lb

## 2015-02-06 DIAGNOSIS — L989 Disorder of the skin and subcutaneous tissue, unspecified: Secondary | ICD-10-CM

## 2015-02-06 DIAGNOSIS — E7800 Pure hypercholesterolemia, unspecified: Secondary | ICD-10-CM | POA: Diagnosis not present

## 2015-02-06 DIAGNOSIS — L309 Dermatitis, unspecified: Secondary | ICD-10-CM | POA: Diagnosis not present

## 2015-02-06 DIAGNOSIS — R21 Rash and other nonspecific skin eruption: Secondary | ICD-10-CM

## 2015-02-06 DIAGNOSIS — Z7689 Persons encountering health services in other specified circumstances: Secondary | ICD-10-CM

## 2015-02-06 DIAGNOSIS — Z23 Encounter for immunization: Secondary | ICD-10-CM | POA: Diagnosis not present

## 2015-02-06 DIAGNOSIS — Z7189 Other specified counseling: Secondary | ICD-10-CM

## 2015-02-06 DIAGNOSIS — J309 Allergic rhinitis, unspecified: Secondary | ICD-10-CM | POA: Diagnosis not present

## 2015-02-06 LAB — LIPID PANEL
CHOLESTEROL: 194 mg/dL (ref 0–200)
HDL: 50.6 mg/dL (ref >39.00)
LDL CALC: 127 mg/dL — AB (ref 0–99)
NonHDL: 143.1
TRIGLYCERIDES: 79 mg/dL (ref 0.0–149.0)
Total CHOL/HDL Ratio: 4
VLDL: 15.8 mg/dL (ref 0.0–40.0)

## 2015-02-06 MED ORDER — TRIAMCINOLONE 0.1 % CREAM:EUCERIN CREAM 1:1: 1.0000 "application " | TOPICAL_CREAM | Freq: Two times a day (BID) | CUTANEOUS | Status: DC

## 2015-02-06 NOTE — Progress Notes (Signed)
Pre visit review using our clinic review tool, if applicable. No additional management support is needed unless otherwise documented below in the visit note. 

## 2015-02-06 NOTE — Telephone Encounter (Signed)
Rx done. 

## 2015-02-06 NOTE — Telephone Encounter (Signed)
Mr. Nathan Boyle' wife called saying his Rx (cream) hasn't been sent to his pharmacy and he was seen today. [facility]  Pt's ph# [phone] Thank you.

## 2015-02-06 NOTE — Addendum Note (Signed)
Addended by: Johnella MoloneyFUNDERBURK, JO A on: 02/06/2015 05:05 PM   Modules accepted: Orders

## 2015-02-06 NOTE — Progress Notes (Signed)
HPI:  Nathan Boyle is here to establish care. He has no new  complaints today. He does have a history of hyperlipidemia and takes a statin for this. He is active with his job, but it does not seem that he does other regular aerobic exercise currently. He reports a good diet and tolerates the statin well. He has allergic rhinitis. Not well controlled on allegra D. Nasal symptoms and PND. He has "eczema" and wants to see dermatologist. Itchy bumps on hands, neck and trunk. Wants refill on steroid cream as this helps.  ROS negative for unless reported above: fevers, unintentional weight loss, hearing or vision loss, chest pain, palpitations, struggling to breath, hemoptysis, melena, hematochezia, hematuria, falls, loc, si, thoughts of self harm  Past Medical History  Diagnosis Date  . Seizures (HCC)     at age 53  . Ulcer     30 yrs ago- age 26-20 per pt.  . Allergy   . Blood transfusion without reported diagnosis     thinks had transfusion with foot surgery 20 + years ago  . Hyperlipidemia     on medicines for this  . Eczema     Past Surgical History  Procedure Laterality Date  . Foot surgery      x2  . Carpal tunnel release      carpal tunnel release right     Family History  Problem Relation Age of Onset  . Hyperlipidemia Mother   . Stroke Mother   . Arthritis Sister   . Stroke Maternal Grandmother   . Colon cancer Neg Hx   . Rectal cancer Neg Hx   . Stomach cancer Neg Hx   . Heart murmur Mother   . Hypertension Mother   . Rheum arthritis Sister   . Sickle cell trait Sister     Social History   Social History  . Marital Status: Married    Spouse Name: N/A  . Number of Children: N/A  . Years of Education: N/A   Social History Main Topics  . Smoking status: Never Smoker   . Smokeless tobacco: Never Used  . Alcohol Use: No  . Drug Use: No  . Sexual Activity: Not Asked   Other Topics Concern  . None   Social History Narrative   Updated 02/06/15   Work: Actor      Lives with: wife and 2 children      Active at work, diet is good     Current outpatient prescriptions:  .  fexofenadine-pseudoephedrine (ALLEGRA-D 12 HOUR) 60-120 MG per tablet, Take 1 tablet by mouth 2 (two) times daily., Disp: 180 tablet, Rfl: 0 .  Multiple Vitamin (MULTIVITAMIN) tablet, Take 1 tablet by mouth daily. One a day, Disp: , Rfl:  .  simvastatin (ZOCOR) 10 MG tablet, TAKE 1 TABLET BY MOUTH EVERY NIGHT AT BEDTIME, Disp: 90 tablet, Rfl: 0  EXAM:  Filed Vitals:   02/06/15 1113  BP: 120/82  Pulse: 80  Temp: 97.7 F (36.5 C)    Body mass index is 31.08 kg/(m^2).  GENERAL: vitals reviewed and listed above, alert, oriented, appears well hydrated and in no acute distress  HEENT: atraumatic, conjunttiva clear, no obvious abnormalities on inspection of external nose and ears  NECK: no obvious masses on inspection  LUNGS: clear to auscultation bilaterally, no wheezes, rales or rhonchi, good air movement  CV: HRRR, no peripheral edema  MS: moves all extremities without noticeable abnormality  PSYCH: pleasant and cooperative,  no obvious depression or anxiety  ASSESSMENT AND PLAN:  Discussed the following assessment and plan:  Hypercholesteremia - Plan: Lipid Panel  Allergic rhinitis, unspecified allergic rhinitis type  Eczema  Skin lesion - Plan: Ambulatory referral to Dermatology  Encounter to establish care  Rash and nonspecific skin eruption -We reviewed the PMH, PSH, FH, SH, Meds and Allergies. -We provided refills for any medications we will prescribe as needed. -We addressed current concerns per orders and patient instructions. -We have asked for records for pertinent exams, studies, vaccines and notes from previous providers. -We have advised patient to follow up per instructions below.   -Patient advised to return or notify a doctor immediately if symptoms worsen or persist or new concerns arise.  Patient  Instructions  BEFORE YOU LEAVE: -flu shot -labs -follow up in 6 months  Try allegra or zyrtec and Flonase for allergies  Try CERAVE CREAM or Cetphil CREAM at least 1-2 times daily along with triamcinilone cream for flares  -We placed a referral for you as discussed to the dermatologist for your skin issues. It usually takes about 1-2 weeks to process and schedule this referral. If you have not heard from us regarding this appointment in 2 weeks please contact our office.   We recommend the following healthy lifestyle measures: - eat a healthy whole foods diet consisting of regular small meals composed of vegetables, fruits, beans, nuts, seeds, healthy meats such as white chicken and fish and whole grains.  - avoid sweets, white starchy foods, fried foods, fast food, processed foods, sodas, red meet and other fattening foods.  - get a least 150-300 minutes of aerobic exercise per week.   -We have ordered labs or studies at this visit. It can take up to 1-2 weeks for results and processing. We will contact you with instructions IF your results are abnormal. Normal results will be released to [facility]. If you have not heard from us or can not find your results in [facility] in 2 weeks please contact our office.            Kriste BasqueKIM, Sorina Derrig R.

## 2015-02-06 NOTE — Patient Instructions (Addendum)
BEFORE YOU LEAVE: -flu shot -labs -follow up in 6 months  Try allegra or zyrtec and Flonase for allergies  Try CERAVE CREAM or Cetphil CREAM at least 1-2 times daily along with triamcinilone cream for flares  -We placed a referral for you as discussed to the dermatologist for your skin issues. It usually takes about 1-2 weeks to process and schedule this referral. If you have not heard from us regarding this appointment in 2 weeks please contact our office.   We recommend the following healthy lifestyle measures: - eat a healthy whole foods diet consisting of regular small meals composed of vegetables, fruits, beans, nuts, seeds, healthy meats such as white chicken and fish and whole grains.  - avoid sweets, white starchy foods, fried foods, fast food, processed foods, sodas, red meet and other fattening foods.  - get a least 150-300 minutes of aerobic exercise per week.   -We have ordered labs or studies at this visit. It can take up to 1-2 weeks for results and processing. We will contact you with instructions IF your results are abnormal. Normal results will be released to [facility]. If you have not heard from us or can not find your results in [facility] in 2 weeks please contact our office.

## 2015-03-07 ENCOUNTER — Other Ambulatory Visit: Payer: Self-pay | Admitting: Family

## 2015-03-07 NOTE — Telephone Encounter (Signed)
Can i send this Rx under your name?  Please advise thank you

## 2015-08-16 ENCOUNTER — Telehealth: Payer: Self-pay | Admitting: Family Medicine

## 2015-08-16 NOTE — Telephone Encounter (Signed)
Error/njr °

## 2015-10-08 ENCOUNTER — Emergency Department (HOSPITAL_COMMUNITY): Payer: BLUE CROSS/BLUE SHIELD

## 2015-10-08 ENCOUNTER — Encounter (HOSPITAL_COMMUNITY): Payer: Self-pay | Admitting: Emergency Medicine

## 2015-10-08 ENCOUNTER — Emergency Department (HOSPITAL_COMMUNITY)
Admission: EM | Admit: 2015-10-08 | Discharge: 2015-10-08 | Disposition: A | Discharge status: Home / Self Care | Payer: BLUE CROSS/BLUE SHIELD | Attending: Emergency Medicine | Admitting: Emergency Medicine

## 2015-10-08 DIAGNOSIS — Z7982 Long term (current) use of aspirin: Secondary | ICD-10-CM | POA: Diagnosis not present

## 2015-10-08 DIAGNOSIS — R079 Chest pain, unspecified: Secondary | ICD-10-CM | POA: Diagnosis present

## 2015-10-08 DIAGNOSIS — E785 Hyperlipidemia, unspecified: Secondary | ICD-10-CM | POA: Diagnosis not present

## 2015-10-08 DIAGNOSIS — R55 Syncope and collapse: Secondary | ICD-10-CM | POA: Insufficient documentation

## 2015-10-08 DIAGNOSIS — Z79899 Other long term (current) drug therapy: Secondary | ICD-10-CM | POA: Insufficient documentation

## 2015-10-08 DIAGNOSIS — R0789 Other chest pain: Secondary | ICD-10-CM | POA: Insufficient documentation

## 2015-10-08 LAB — BASIC METABOLIC PANEL
ANION GAP: 11 (ref 5–15)
BUN: 11 mg/dL (ref 6–20)
CHLORIDE: 105 mmol/L (ref 101–111)
CO2: 24 mmol/L (ref 22–32)
Calcium: 9.7 mg/dL (ref 8.9–10.3)
Creatinine, Ser: 0.93 mg/dL (ref 0.61–1.24)
GFR calc non Af Amer: >60 mL/min (ref >60)
Glucose, Bld: 101 mg/dL — ABNORMAL HIGH (ref 65–99)
POTASSIUM: 3.4 mmol/L — AB (ref 3.5–5.1)
Sodium: 140 mmol/L (ref 135–145)

## 2015-10-08 LAB — I-STAT TROPONIN, ED
TROPONIN I, POC: 0 ng/mL (ref 0.00–0.08)
TROPONIN I, POC: 0 ng/mL (ref 0.00–0.08)

## 2015-10-08 LAB — CK: CK TOTAL: 143 U/L (ref 49–397)

## 2015-10-08 LAB — CBC
HCT: 41.9 % (ref 39.0–52.0)
HEMOGLOBIN: 13.7 g/dL (ref 13.0–17.0)
MCH: 28.8 pg (ref 26.0–34.0)
MCHC: 32.7 g/dL (ref 30.0–36.0)
MCV: 88.2 fL (ref 78.0–100.0)
Platelets: 200 10*3/uL (ref 150–400)
RBC: 4.75 MIL/uL (ref 4.22–5.81)
RDW: 12.1 % (ref 11.5–15.5)
WBC: 6.6 10*3/uL (ref 4.0–10.5)

## 2015-10-08 MED ORDER — OMEPRAZOLE 20 MG PO CPDR: 20.0000 mg | DELAYED_RELEASE_CAPSULE | Freq: Every day | ORAL | 0 refills | Status: DC

## 2015-10-08 NOTE — ED Notes (Signed)
Patient Alert and oriented X4. Stable and ambulatory. Patient verbalized understanding of the discharge instructions.  Patient belongings were taken by the patient.  

## 2015-10-08 NOTE — ED Triage Notes (Signed)
Cp since laST NIGHT RT ARM TINGLING, no n/v/sob tingling in feett

## 2015-10-08 NOTE — ED Provider Notes (Signed)
[facility]-EMERGENCY DEPT Provider Note   CSN: [id number] Arrival date & time: 10/08/15  1444  First Provider Contact:  None       History   Chief Complaint Chief Complaint  Patient presents with  . Chest Pain    HPI Nathan Boyle is a 50 y.o. male.  Patient is a 50 year old male with history of hyperlipidemia, who presents emergency Department for evaluation of 2 brief episodes of central sharp chest pain that lasted 2-3 seconds. It first occurred last night while at work at approximately 11 PM. He works a Health and safety inspector job and was not exerting himself. He states the pain shot up and down the center of his chest without radiation to his back and he felt anxious, lightheaded, and had some new numbness and tingling in his right arm.  He had another episode when he got home from work at approximately 1 AM. He is also at rest then, this one was also briefly felt more like a quick squeeze the center of his chest. He again felt very anxious and describes having a "panic attack" with brief and intermittent tingling down both his legs. He decided to sit on the couch and since the symptoms would go away, he nodded off and woke up at 6 AM without any chest pain and with only mild right arm tingling.  He is active at church today, states he has exerted himself and did not have any recurrent symptoms. He denies any abdominal pain, reflux, nausea, vomiting, diaphoresis, shortness of breath, orthopnea, PND, lower extremity edema, palpitations, cough, wheeze, fevers.     The history is provided by the patient.  Chest Pain   This is a new problem. The current episode started yesterday. The problem occurs rarely. The problem has been resolved. The pain is present in the substernal region. The pain is at a severity of 5/10. The patient is experiencing no pain. The quality of the pain is described as brief and sharp. The pain does not radiate. Duration of episode(s) is 3 seconds. Associated symptoms include near-syncope  (patient felt briefly lightheaded) and numbness (after first episode of chest pain he developed numbness and tingling in his right arm). Pertinent negatives include no abdominal pain, no back pain, no cough, no diaphoresis, no dizziness, no exertional chest pressure, no fever, no hemoptysis, no irregular heartbeat, no leg pain, no lower extremity edema, no malaise/fatigue, no nausea, no orthopnea, no palpitations, no PND, no shortness of breath, no syncope, no vomiting and no weakness. He has tried rest for the symptoms. The treatment provided significant relief. Risk factors include obesity, male gender and stress.  His past medical history is significant for anxiety/panic attacks and hyperlipidemia.  Pertinent negatives for past medical history include no CAD, no diabetes, no hypertension and no stimulant use.  His family medical history is significant for hypertension.  Pertinent negatives for family medical history include: no CAD, no heart disease, no early MI and no sudden death.    Past Medical History:  Diagnosis Date  . Allergy   . Blood transfusion without reported diagnosis    thinks had transfusion with foot surgery 20 + years ago  . Eczema   . Hyperlipidemia    on medicines for this  . Seizures (HCC)    at age 69  . Ulcer    30 yrs ago- age [age] per pt.    Patient Active Problem List   Diagnosis Date Noted  . Allergic rhinitis 02/06/2015  . Rash and nonspecific  skin eruption 02/06/2015  . Hypercholesteremia 12/29/2013    Past Surgical History:  Procedure Laterality Date  . CARPAL TUNNEL RELEASE     carpal tunnel release right   . FOOT SURGERY     x2       Home Medications    Prior to Admission medications   Medication Sig Start Date End Date Taking? Authorizing Provider  acetaminophen (TYLENOL) 500 MG tablet Take 1,500 mg by mouth daily as needed (aches/ pain).   Yes Historical Provider, MD  Aspirin-Acetaminophen-Caffeine (GOODY HEADACHE PO) Take 1 packet by  mouth daily as needed (headache).   Yes Historical Provider, MD  fexofenadine-pseudoephedrine (ALLEGRA-D 12 HOUR) 60-120 MG per tablet Take 1 tablet by mouth 2 (two) times daily. Patient taking differently: Take 1 tablet by mouth daily.  07/05/13  Yes Eulis Foster, FNP  loratadine (CLARITIN) 10 MG tablet Take 10 mg by mouth at bedtime.   Yes Historical Provider, MD  simvastatin (ZOCOR) 10 MG tablet TAKE 1 TABLET BY MOUTH EVERY NIGHT AT BEDTIME 03/07/15  Yes Terressa Koyanagi, DO  Tetrahydrozoline HCl (VISINE OP) Place 1 drop into both eyes daily as needed (dry eyes/ itching).   Yes Historical Provider, MD  omeprazole (PRILOSEC) 20 MG capsule Take 1 capsule (20 mg total) by mouth daily. 10/08/15   Danelle Berry, PA-C  Triamcinolone Acetonide (TRIAMCINOLONE 0.1 % CREAM : EUCERIN) CREA Apply 1 application topically 2 (two) times daily. Patient not taking: Reported on 10/08/2015 02/06/15   Terressa Koyanagi, DO    Family History Family History  Problem Relation Age of Onset  . Hyperlipidemia Mother   . Stroke Mother   . Heart murmur Mother   . Hypertension Mother   . Arthritis Sister   . Stroke Maternal Grandmother   . Rheum arthritis Sister   . Sickle cell trait Sister   . Colon cancer Neg Hx   . Rectal cancer Neg Hx   . Stomach cancer Neg Hx     Social History Social History  Substance Use Topics  . Smoking status: Never Smoker  . Smokeless tobacco: Never Used  . Alcohol use No     Allergies   Penicillins   Review of Systems Review of Systems  Constitutional: Negative for diaphoresis, fever and malaise/fatigue.  Respiratory: Negative for cough, hemoptysis and shortness of breath.   Cardiovascular: Positive for chest pain and near-syncope (patient felt briefly lightheaded). Negative for palpitations, orthopnea, syncope and PND.  Gastrointestinal: Negative for abdominal pain, nausea and vomiting.  Musculoskeletal: Negative for back pain.  Neurological: Positive for numbness (after first  episode of chest pain he developed numbness and tingling in his right arm). Negative for dizziness and weakness.  All other systems reviewed and are negative.    Physical Exam Updated Vital Signs BP 113/74   Pulse 72   Temp 97.8 F (36.6 C) (Oral)   Resp 16   SpO2 98%   Physical Exam  Constitutional: He is oriented to person, place, and time. He appears well-developed and well-nourished. No distress.  HENT:  Head: Normocephalic and atraumatic.  Nose: Nose normal.  Mouth/Throat: Oropharynx is clear and moist. No oropharyngeal exudate.  Eyes: Conjunctivae and EOM are normal. Pupils are equal, round, and reactive to light. Right eye exhibits no discharge. Left eye exhibits no discharge. No scleral icterus.  Neck: Normal range of motion. No JVD present. No tracheal deviation present. No thyromegaly present.  Cardiovascular: Normal rate, regular rhythm, normal heart sounds and intact distal pulses.  Exam reveals no gallop and no friction rub.   No murmur heard. Pulmonary/Chest: Effort normal and breath sounds normal. No respiratory distress. He has no wheezes. He has no rales. He exhibits no tenderness.  Abdominal: Soft. Bowel sounds are normal. He exhibits no distension and no mass. There is no tenderness. There is no rebound and no guarding.  Musculoskeletal: Normal range of motion. He exhibits no edema or tenderness.  Lymphadenopathy:    He has no cervical adenopathy.  Neurological: He is alert and oriented to person, place, and time. He has normal reflexes. He displays normal reflexes. No cranial nerve deficit. He exhibits normal muscle tone. Coordination normal.  Skin: Skin is warm and dry. Capillary refill takes less than 2 seconds. No rash noted. He is not diaphoretic. No erythema. No pallor.  Psychiatric: He has a normal mood and affect. His behavior is normal. Judgment and thought content normal.  Nursing note and vitals reviewed.    ED Treatments / Results  Labs (all labs  ordered are listed, but only abnormal results are displayed) Labs Reviewed  BASIC METABOLIC PANEL - Abnormal; Notable for the following:       Result Value   Potassium 3.4 (*)    Glucose, Bld 101 (*)    All other components within normal limits  CBC  CK  I-STAT TROPOININ, ED  I-STAT TROPOININ, ED    EKG  EKG Interpretation  Date/Time:  Sunday October 08 2015 14:53:05 EDT Ventricular Rate:  92 PR Interval:  184 QRS Duration: 94 QT Interval:  374 QTC Calculation: 462 R Axis:   -33 Text Interpretation:  Normal sinus rhythm Left axis deviation Minimal voltage criteria for LVH, may be normal variant Nonspecific T wave abnormality Prolonged QT Abnormal ECG Confirmed by BELFI  MD, MELANIE ([id number]) on 10/09/2015 9:11:11 AM       Radiology No results found.  Procedures Procedures (including critical care time)  Medications Ordered in ED Medications - No data to display   Initial Impression / Assessment and Plan / ED Course  I have reviewed the triage vital signs and the nursing notes.  Pertinent labs & imaging results that were available during my care of the patient were reviewed by me and considered in my medical decision making (see chart for details).  Clinical Course  Value Comment By Time  DG Chest 2 View (Reviewed) Danelle Berry, PA-C 07/30 1705    Patient is a 50 year old male presents emergency Department with 2 brief episodes of central chest pain, no pain currently, but has right arm tingling.  History provided is low suspicion for ACS. Patient is low risk with a heart score of 3.  Cardiac and pulmonary exams are benign and normal.  Patient has normal sensation, normal strength, normal capillary refill in his right arm.  Do not suspect PE, no respiratory complaints, normal heart rate.  Chest x-ray is negative.  Labs are unremarkable and troponin x 2 negative. Will d/c home with PPI trial.   Final Clinical Impressions(s) / ED Diagnoses   Final diagnoses:  Atypical  chest pain    New Prescriptions Discharge Medication List as of 10/08/2015  6:50 PM    START taking these medications   Details  omeprazole (PRILOSEC) 20 MG capsule Take 1 capsule (20 mg total) by mouth daily., Starting Sun 10/08/2015, Print         Danelle Berry, PA-C 10/12/15 0710    Rolland Porter, MD 10/24/15 0130

## 2015-10-09 NOTE — ED Provider Notes (Signed)
Patient seen and evaluated. Discussed with PA. Patient reports intermittent numbness to his right arm and bilateral legs at times of stress. He has no significant cardiac risk factors has. He is a symptomatic care. Normal EKG and enzymes. I do not feel additional studies are required at this time. Discharge and follow-up with primary care physician ER with acute changes only.   Rolland Porter, MD 10/09/15 [phone]

## 2016-05-07 ENCOUNTER — Encounter: Payer: Self-pay | Admitting: Family Medicine

## 2016-06-05 NOTE — Progress Notes (Signed)
HPI:  Here for CPE:  -Concerns and/or follow up today:  I saw him once when he established care over a year ago. PMH significant for HLD, allergic rhinitis, eczema and obesity. Due for labs, hep c screening and flu vaccine. -Diet: variety of foods, balance and well rounded, larger portion sizes  -Exercise: does some walking on a regular basis  -Diabetes and Dyslipidemia Screening: fasting for labs  -Vaccines: UTD  -sexual activity: yes, male partner, no new partners  -wants STI testing, Hep C screening (if born [id number]): declines STI testing, agrees to Hep c screen  -FH colon or prstate ca: see FH Last colon cancer screening: reports normal at age 51 and no sig fam hx Last prostate ca screening: discussed risks benefits  dre and psa, he opted for psa and urology evaluation if abnormal  -Alcohol, Tobacco, drug use: see social history  Review of Systems - no fevers, unintentional weight loss, vision loss, hearing loss, chest pain, sob, hemoptysis, melena, hematochezia, hematuria, genital discharge, changing or concerning skin lesions, bleeding, bruising, loc, thoughts of self harm or SI  Past Medical History:  Diagnosis Date  . Allergy   . Blood transfusion without reported diagnosis    thinks had transfusion with foot surgery 20 + years ago  . Eczema   . Hyperlipidemia    on medicines for this  . Seizures (HCC)    at age 51  . Ulcer (HCC)    30 yrs ago- age [age] per pt.    Past Surgical History:  Procedure Laterality Date  . CARPAL TUNNEL RELEASE     carpal tunnel release right   . FOOT SURGERY     x2    Family History  Problem Relation Age of Onset  . Hyperlipidemia Mother   . Stroke Mother   . Heart murmur Mother   . Hypertension Mother   . Arthritis Sister   . Stroke Maternal Grandmother   . Rheum arthritis Sister   . Sickle cell trait Sister   . Colon cancer Neg Hx   . Rectal cancer Neg Hx   . Stomach cancer Neg Hx     Social History    Social History  . Marital status: Married    Spouse name: N/A  . Number of children: N/A  . Years of education: N/A   Social History Main Topics  . Smoking status: Never Smoker  . Smokeless tobacco: Never Used  . Alcohol use No  . Drug use: No  . Sexual activity: Not Asked   Other Topics Concern  . None   Social History Narrative   Updated 02/06/15   Work: [employer]      Lives with: wife and 2 children      Active at work, diet is good     Current Outpatient Prescriptions:  .  fexofenadine-pseudoephedrine (ALLEGRA-D 12 HOUR) 60-120 MG per tablet, Take 1 tablet by mouth 2 (two) times daily. (Patient taking differently: Take 1 tablet by mouth daily. ), Disp: 180 tablet, Rfl: 0 .  simvastatin (ZOCOR) 10 MG tablet, TAKE 1 TABLET BY MOUTH EVERY NIGHT AT BEDTIME, Disp: 90 tablet, Rfl: 3  EXAM:  Vitals:   06/06/16 0704  BP: 118/78  Pulse: 83  Temp: 97.6 F (36.4 C)  TempSrc: Oral  Weight: 239 lb (108.4 kg)  Height: 5' 10.5" (1.791 m)    Estimated body mass index is 33.81 kg/m as calculated from the following:   Height as of this encounter: 5'  10.5" (1.791 m).   Weight as of this encounter: 239 lb (108.4 kg).  GENERAL: vitals reviewed and listed below, alert, oriented, appears well hydrated and in no acute distress  HEENT: head atraumatic, PERRLA, normal appearance of eyes, ears, nose and mouth. moist mucus membranes.  NECK: supple, no masses or lymphadenopathy  LUNGS: clear to auscultation bilaterally, no rales, rhonchi or wheeze  CV: HRRR, no peripheral edema or cyanosis, normal pedal pulses  ABDOMEN: bowel sounds normal, soft, non tender to palpation, no masses, no rebound or guarding  GU: declined  RECTAL: declined  SKIN: no rash or abnormal lesions  MS: normal gait, moves all extremities normally  NEURO: normal gait, speech and thought processing grossly intact, muscle tone grossly intact throughout  PSYCH: normal affect,  pleasant and cooperative  ASSESSMENT AND PLAN:  Discussed the following assessment and plan:  Encounter for preventive health examination - Plan: Hemoglobin A1c  Hep c screening - Plan: Hepatitis C antibody  Prostate cancer screening encounter, options and risks discussed - Plan: PSA  Hypercholesteremia - Plan: Lipid panel  BMI 33.0-33.9,adult - Plan: Hemoglobin A1c   -Discussed and advised all [place] preventive services health task force level A and B recommendations for age, sex and risks.  --Advised at least 150 minutes of exercise per week and a healthy diet with avoidance of (less then 1 serving per week) processed foods, white starches, red meat, fast foods and sweets and consisting of: * 5-9 servings of fresh fruits and vegetables (not corn or potatoes) *nuts and seeds, beans *olives and olive oil *lean meats such as fish and white chicken  *whole grains  -FASTING labs, studies and vaccines per orders this encounter   Patient advised to return to clinic immediately if symptoms worsen or persist or new concerns.  Patient Instructions  BEFORE YOU LEAVE: -follow up: 1 year for annual exam -labs  We have ordered labs or studies at this visit. It can take up to 1-2 weeks for results and processing. IF results require follow up or explanation, we will call you with instructions. Clinically stable results will be released to your [facility]. If you have not heard from [place] or cannot find your results in [facility] in 2 weeks please contact our office at [phone].  If you are not yet signed up for [facility], please consider signing up.   We recommend the following healthy lifestyle for LIFE: 1) Small portions.   Tip: eat off of a salad plate instead of a dinner plate.  Tip: It is ok to feel hungry after a meal - that likely means you ate an appropriate portion.  Tip: if you need more or a snack choose fruits, veggies and/or a handful of nuts or seeds.  2) Eat a healthy clean  diet.  * Tip: Avoid (less then 1 serving per week): processed foods, sweets, sweetened drinks, white starches (rice, flour, bread, potatoes, pasta, etc), red meat, fast foods, butter  *Tip: CHOOSE instead   * 5-9 servings per day of fresh or frozen fruits and vegetables (but not corn, potatoes, bananas, canned or dried fruit)   *nuts and seeds, beans   *olives and olive oil   *small portions of lean meats such as fish and white chicken    *small portions of whole grains  3)Get at least 150 minutes of sweaty aerobic exercise per week.  4)Reduce stress - consider counseling, meditation and relaxation to balance other aspects of your life.   WE NOW OFFER   [place]  [facility] FAST TRACK!!!  SAME DAY Appointments for ACUTE CARE  Such as: Sprains, Injuries, cuts, abrasions, rashes, muscle pain, joint pain, back pain Colds, flu, sore throats, headache, allergies, cough, fever  Ear pain, sinus and eye infections Abdominal pain, nausea, vomiting, diarrhea, upset stomach Animal/insect bites  3 Easy Ways to Schedule: Walk-In Scheduling Call in scheduling Mychart Sign-up: [email or web address]              No Follow-up on file.   Kriste Basque R., DO

## 2016-06-06 ENCOUNTER — Encounter: Payer: Self-pay | Admitting: Family Medicine

## 2016-06-06 ENCOUNTER — Ambulatory Visit (INDEPENDENT_AMBULATORY_CARE_PROVIDER_SITE_OTHER): Payer: BLUE CROSS/BLUE SHIELD | Admitting: Family Medicine

## 2016-06-06 VITALS — BP 118/78 | HR 83 | Temp 97.6°F | Ht 70.5 in | Wt 239.0 lb

## 2016-06-06 DIAGNOSIS — Z7289 Other problems related to lifestyle: Secondary | ICD-10-CM | POA: Diagnosis not present

## 2016-06-06 DIAGNOSIS — Z6833 Body mass index (BMI) 33.0-33.9, adult: Secondary | ICD-10-CM | POA: Diagnosis not present

## 2016-06-06 DIAGNOSIS — E7800 Pure hypercholesterolemia, unspecified: Secondary | ICD-10-CM

## 2016-06-06 DIAGNOSIS — Z0000 Encounter for general adult medical examination without abnormal findings: Secondary | ICD-10-CM

## 2016-06-06 DIAGNOSIS — Z125 Encounter for screening for malignant neoplasm of prostate: Secondary | ICD-10-CM | POA: Diagnosis not present

## 2016-06-06 LAB — LIPID PANEL
CHOL/HDL RATIO: 4
CHOLESTEROL: 209 mg/dL — AB (ref 0–200)
HDL: 48.6 mg/dL (ref >39.00)
LDL CALC: 136 mg/dL — AB (ref 0–99)
NonHDL: 160.21
TRIGLYCERIDES: 120 mg/dL (ref 0.0–149.0)
VLDL: 24 mg/dL (ref 0.0–40.0)

## 2016-06-06 LAB — HEMOGLOBIN A1C: Hgb A1c MFr Bld: 4.8 % (ref 4.6–6.5)

## 2016-06-06 LAB — PSA: PSA: 0.36 ng/mL (ref 0.10–4.00)

## 2016-06-06 NOTE — Patient Instructions (Signed)
BEFORE YOU LEAVE: -follow up: 1 year for annual exam -labs  We have ordered labs or studies at this visit. It can take up to 1-2 weeks for results and processing. IF results require follow up or explanation, we will call you with instructions. Clinically stable results will be released to your [facility]. If you have not heard from us or cannot find your results in [facility] in 2 weeks please contact our office at [phone].  If you are not yet signed up for [facility], please consider signing up.   We recommend the following healthy lifestyle for LIFE: 1) Small portions.   Tip: eat off of a salad plate instead of a dinner plate.  Tip: It is ok to feel hungry after a meal - that likely means you ate an appropriate portion.  Tip: if you need more or a snack choose fruits, veggies and/or a handful of nuts or seeds.  2) Eat a healthy clean diet.  * Tip: Avoid (less then 1 serving per week): processed foods, sweets, sweetened drinks, white starches (rice, flour, bread, potatoes, pasta, etc), red meat, fast foods, butter  *Tip: CHOOSE instead   * 5-9 servings per day of fresh or frozen fruits and vegetables (but not corn, potatoes, bananas, canned or dried fruit)   *nuts and seeds, beans   *olives and olive oil   *small portions of lean meats such as fish and white chicken    *small portions of whole grains  3)Get at least 150 minutes of sweaty aerobic exercise per week.  4)Reduce stress - consider counseling, meditation and relaxation to balance other aspects of your life.   WE NOW OFFER   [place] [facility] FAST TRACK!!!  SAME DAY Appointments for ACUTE CARE  Such as: Sprains, Injuries, cuts, abrasions, rashes, muscle pain, joint pain, back pain Colds, flu, sore throats, headache, allergies, cough, fever  Ear pain, sinus and eye infections Abdominal pain, nausea, vomiting, diarrhea, upset stomach Animal/insect bites  3 Easy Ways to Schedule: Walk-In Scheduling Call in  scheduling Mychart Sign-up: [email or web address]

## 2016-06-06 NOTE — Progress Notes (Signed)
Pre visit review using our clinic review tool, if applicable. No additional management support is needed unless otherwise documented below in the visit note. 

## 2016-06-07 LAB — HEPATITIS C ANTIBODY: HCV AB: NEGATIVE

## 2016-12-29 ENCOUNTER — Other Ambulatory Visit: Payer: Self-pay | Admitting: Family Medicine

## 2017-11-24 ENCOUNTER — Telehealth: Payer: Self-pay

## 2017-11-24 NOTE — Telephone Encounter (Signed)
Ok

## 2017-11-24 NOTE — Telephone Encounter (Signed)
Copied from CRM [phone]. Topic: Appointment Scheduling - Scheduling Inquiry for Clinic >> Nov 24, 2017  9:25 AM Leafy Roobinson, Norma J wrote: Reason for CRM: pt no longer wants to see dr Selena Battenkim. Pt would like to see male dr Ashley Royaltymatthews or dr Doreene Burkekremer. Pt lives near [place] location    Dr. Selena BattenKim - Please advise on pt's request to transfer care. Thanks!

## 2017-11-24 NOTE — Telephone Encounter (Signed)
Please see Transfer of Care request below: [facility]!

## 2017-11-26 NOTE — Telephone Encounter (Signed)
Dr. Lavada MesiMatthews okayed transferred care

## 2017-11-26 NOTE — Telephone Encounter (Signed)
See below

## 2017-12-17 ENCOUNTER — Ambulatory Visit: Payer: BLUE CROSS/BLUE SHIELD | Admitting: Family Medicine

## 2017-12-17 ENCOUNTER — Encounter: Payer: Self-pay | Admitting: Family Medicine

## 2017-12-17 VITALS — BP 120/82 | HR 76 | Temp 99.0°F | Ht 70.5 in | Wt 239.0 lb

## 2017-12-17 DIAGNOSIS — Z23 Encounter for immunization: Secondary | ICD-10-CM | POA: Diagnosis not present

## 2017-12-17 DIAGNOSIS — Z0001 Encounter for general adult medical examination with abnormal findings: Secondary | ICD-10-CM | POA: Diagnosis not present

## 2017-12-17 DIAGNOSIS — R351 Nocturia: Secondary | ICD-10-CM | POA: Insufficient documentation

## 2017-12-17 DIAGNOSIS — E538 Deficiency of other specified B group vitamins: Secondary | ICD-10-CM | POA: Diagnosis not present

## 2017-12-17 LAB — CBC
HEMATOCRIT: 41.4 % (ref 39.0–52.0)
HEMOGLOBIN: 13.7 g/dL (ref 13.0–17.0)
MCHC: 33 g/dL (ref 30.0–36.0)
MCV: 88.5 fl (ref 78.0–100.0)
PLATELETS: 201 10*3/uL (ref 150.0–400.0)
RBC: 4.68 Mil/uL (ref 4.22–5.81)
RDW: 12.4 % (ref 11.5–15.5)
WBC: 4.6 10*3/uL (ref 4.0–10.5)

## 2017-12-17 LAB — COMPREHENSIVE METABOLIC PANEL
ALBUMIN: 4.7 g/dL (ref 3.5–5.2)
ALK PHOS: 73 U/L (ref 39–117)
ALT: 23 U/L (ref 0–53)
AST: 18 U/L (ref 0–37)
BILIRUBIN TOTAL: 0.8 mg/dL (ref 0.2–1.2)
BUN: 11 mg/dL (ref 6–23)
CALCIUM: 10.3 mg/dL (ref 8.4–10.5)
CO2: 30 meq/L (ref 19–32)
Chloride: 106 mEq/L (ref 96–112)
Creatinine, Ser: 0.98 mg/dL (ref 0.40–1.50)
GFR: 102.55 mL/min (ref >60.00)
GLUCOSE: 126 mg/dL — AB (ref 70–99)
Potassium: 4.2 mEq/L (ref 3.5–5.1)
Sodium: 142 mEq/L (ref 135–145)
Total Protein: 7.7 g/dL (ref 6.0–8.3)

## 2017-12-17 LAB — PSA: PSA: 0.34 ng/mL (ref 0.10–4.00)

## 2017-12-17 LAB — URINALYSIS, ROUTINE W REFLEX MICROSCOPIC
Bilirubin Urine: NEGATIVE
HGB URINE DIPSTICK: NEGATIVE
Ketones, ur: NEGATIVE
Nitrite: NEGATIVE
RBC / HPF: NONE SEEN (ref 0–?)
SPECIFIC GRAVITY, URINE: 1.015 (ref 1.000–1.030)
TOTAL PROTEIN, URINE-UPE24: NEGATIVE
UROBILINOGEN UA: 2 — AB (ref 0.0–1.0)
Urine Glucose: NEGATIVE
pH: 7 (ref 5.0–8.0)

## 2017-12-17 LAB — LIPID PANEL
CHOL/HDL RATIO: 4
Cholesterol: 203 mg/dL — ABNORMAL HIGH (ref 0–200)
HDL: 51.1 mg/dL (ref >39.00)
LDL Cholesterol: 135 mg/dL — ABNORMAL HIGH (ref 0–99)
NONHDL: 152.21
Triglycerides: 87 mg/dL (ref 0.0–149.0)
VLDL: 17.4 mg/dL (ref 0.0–40.0)

## 2017-12-17 LAB — VITAMIN B12: Vitamin B-12: 453 pg/mL (ref 211–911)

## 2017-12-17 NOTE — Patient Instructions (Signed)
Health Maintenance, Male A healthy lifestyle and preventive care is important for your health and wellness. Ask your health care provider about what schedule of regular examinations is right for you. What should I know about weight and diet? Eat a Healthy Diet  Eat plenty of vegetables, fruits, whole grains, low-fat dairy products, and lean protein.  Do not eat a lot of foods high in solid fats, added sugars, or salt.  Maintain a Healthy Weight Regular exercise can help you achieve or maintain a healthy weight. You should:  Do at least 150 minutes of exercise each week. The exercise should increase your heart rate and make you sweat (moderate-intensity exercise).  Do strength-training exercises at least twice a week.  Watch Your Levels of Cholesterol and Blood Lipids  Have your blood tested for lipids and cholesterol every 5 years starting at 54 years of age. If you are at high risk for heart disease, you should start having your blood tested when you are 54 years old. You may need to have your cholesterol levels checked more often if: ? Your lipid or cholesterol levels are high. ? You are older than 54 years of age. ? You are at high risk for heart disease.  What should I know about cancer screening? Many types of cancers can be detected early and may often be prevented. Lung Cancer  You should be screened every year for lung cancer if: ? You are a current smoker who has smoked for at least 30 years. ? You are a former smoker who has quit within the past 15 years.  Talk to your health care provider about your screening options, when you should start screening, and how often you should be screened.  Colorectal Cancer  Routine colorectal cancer screening usually begins at 54 years of age and should be repeated every 5-10 years until you are 54 years old. You may need to be screened more often if early forms of precancerous polyps or small growths are found. Your health care provider  may recommend screening at an earlier age if you have risk factors for colon cancer.  Your health care provider may recommend using home test kits to check for hidden blood in the stool.  A small camera at the end of a tube can be used to examine your colon (sigmoidoscopy or colonoscopy). This checks for the earliest forms of colorectal cancer.  Prostate and Testicular Cancer  Depending on your age and overall health, your health care provider may do certain tests to screen for prostate and testicular cancer.  Talk to your health care provider about any symptoms or concerns you have about testicular or prostate cancer.  Skin Cancer  Check your skin from head to toe regularly.  Tell your health care provider about any new moles or changes in moles, especially if: ? There is a change in a mole's size, shape, or color. ? You have a mole that is larger than a pencil eraser.  Always use sunscreen. Apply sunscreen liberally and repeat throughout the day.  Protect yourself by wearing long sleeves, pants, a wide-brimmed hat, and sunglasses when outside.  What should I know about heart disease, diabetes, and high blood pressure?  If you are 18-39 years of age, have your blood pressure checked every 3-5 years. If you are 40 years of age or older, have your blood pressure checked every year. You should have your blood pressure measured twice-once when you are at a hospital or clinic, and once when   you are not at a hospital or clinic. Record the average of the two measurements. To check your blood pressure when you are not at a hospital or clinic, you can use: ? An automated blood pressure machine at a pharmacy. ? A home blood pressure monitor.  Talk to your health care provider about your target blood pressure.  If you are between 18-11 years old, ask your health care provider if you should take aspirin to prevent heart disease.  Have regular diabetes screenings by checking your fasting blood  sugar level. ? If you are at a normal weight and have a low risk for diabetes, have this test once every three years after the age of 70. ? If you are overweight and have a high risk for diabetes, consider being tested at a younger age or more often.  A one-time screening for abdominal aortic aneurysm (AAA) by ultrasound is recommended for men aged 65-75 years who are current or former smokers. What should I know about preventing infection? Hepatitis B If you have a higher risk for hepatitis B, you should be screened for this virus. Talk with your health care provider to find out if you are at risk for hepatitis B infection. Hepatitis C Blood testing is recommended for:  Everyone born from [birth year] through 1967.  Anyone with known risk factors for hepatitis C.  Sexually Transmitted Diseases (STDs)  You should be screened each year for STDs including gonorrhea and chlamydia if: ? You are sexually active and are younger than 54 years of age. ? You are older than 54 years of age and your health care provider tells you that you are at risk for this type of infection. ? Your sexual activity has changed since you were last screened and you are at an increased risk for chlamydia or gonorrhea. Ask your health care provider if you are at risk.  Talk with your health care provider about whether you are at high risk of being infected with HIV. Your health care provider may recommend a prescription medicine to help prevent HIV infection.  What else can I do?  Schedule regular health, dental, and eye exams.  Stay current with your vaccines (immunizations).  Do not use any tobacco products, such as cigarettes, chewing tobacco, and e-cigarettes. If you need help quitting, ask your health care provider.  Limit alcohol intake to no more than 2 drinks per day. One drink equals 12 ounces of beer, 5 ounces of wine, or 1 ounces of hard liquor.  Do not use street drugs.  Do not share needles.  Ask your  health care provider for help if you need support or information about quitting drugs.  Tell your health care provider if you often feel depressed.  Tell your health care provider if you have ever been abused or do not feel safe at home. This information is not intended to replace advice given to you by your health care provider. Make sure you discuss any questions you have with your health care provider. Document Released: 08/24/2007 Document Revised: 10/25/2015 Document Reviewed: 11/29/2014 Elsevier Interactive Patient Education  2018 [employer].  How to Increase Your Level of Physical Activity Getting regular physical activity is important for your overall health and well-being. Most people do not get enough exercise. There are easy ways to increase your level of physical activity, even if you have not been very active in the past or you are just starting out. Why is physical activity important? Physical activity has many  short-term and long-term health benefits. Regular exercise can:  Help you lose weight or maintain a healthy weight.  Strengthen your muscles and bones.  Boost your mood and improve self-esteem.  Reduce your risk of certain long-term (chronic) diseases, like heart disease, cancer, and diabetes.  Help you stay capable of walking and moving around (mobile) as you age.  Prevent accidents, such as falls, as you age.  Increase life expectancy.  What are the benefits of being physically active on a regular basis? In addition to improving your physical health, being physically active on most days of the week can help you in ways that you may not expect. Benefits of regular physical activity may include:  Feeling good about your body.  Being able to move around more easily and for longer periods of time without getting tired (increased stamina).  Finding new sources of fun and enjoyment.  Meeting new people who share a common interest.  Being able to fight off  illness better (enhanced immunity).  Being able to sleep better.  What can happen if I am not physically active on a regular basis? Not getting enough physical activity can lead to an unhealthy lifestyle and future health problems. This can increase your chances of:  Becoming overweight or obese.  Becoming sick.  Developing chronic illnesses, like heart disease or diabetes.  Having mental health problems, like depression or anxiety.  Having sleep problems.  Having trouble walking or getting yourself around (reduced mobility).  Injuring yourself in a fall as you get older.  What steps can I take to be more physically active?  Check with your health care provider about how to get started. Ask your health care provider what activities are safe for you.  Start out slowly. Walking or doing some simple chair exercises is a good place to start, especially if you have not been active before or for a long time.  Try to find activities that you enjoy. You are more likely to commit to an exercise routine if it does not feel like a chore.  If you have bone or joint problems, choose low-impact exercises, like walking or swimming.  Include physical activity in your everyday routine.  Invite friends or family members to exercise with you. This also will help you commit to your workout plan.  Set goals that you can work toward.  Aim for at least 150 minutes of moderate-intensity exercise each week. Examples of moderate-intensity exercise include walking or riding a bike. Where to find more information:  Centers for Disease Control and Prevention: [employer]is  President's [employer] on [employer], Sports & Nutrition www.http://villegas.org/  ChooseMyPlate: [employer] Contact a health care provider if:  You have headaches, muscle aches, or joint pain.  You feel dizzy or light-headed while exercising.  You faint.  You have  chest pain while exercising. Summary  Exercise benefits your mind and body at any age, even if you are just starting out.  If you have a chronic illness or have not been active for a while, check with your health care provider before increasing your physical activity.  Choose activities that are safe and enjoyable for you.Ask your health care provider what activities are safe for you.  Start slowly. Tell your health care provider if you have problems as you start to increase your activity level. This information is not intended to replace advice given to you by your health care provider. Make sure you discuss any questions you have with your health care provider. Document Released:  02/15/2016 Document Revised: 02/15/2016 Document Reviewed: 02/15/2016 Elsevier Interactive Patient Education  2018 [employer].  Benign Prostatic Hyperplasia Benign prostatic hyperplasia (BPH) is an enlarged prostate gland that is caused by the normal aging process and not by cancer. The prostate is a walnut-sized gland that is involved in the production of semen. It is located in front of the rectum and below the bladder. The bladder stores urine and the urethra is the tube that carries the urine out of the body. The prostate may get bigger as a man gets older. An enlarged prostate can press on the urethra. This can make it harder to pass urine. The build-up of urine in the bladder can cause infection. Back pressure and infection may progress to bladder damage and kidney (renal) failure. What are the causes? This condition is part of a normal aging process. However, not all men develop problems from this condition. If the prostate enlarges away from the urethra, urine flow will not be blocked. If it enlarges toward the urethra and compresses it, there will be problems passing urine. What increases the risk? This condition is more likely to develop in men over the age of 50 years. What are the signs or  symptoms? Symptoms of this condition include:  Getting up often during the night to urinate.  Needing to urinate frequently during the day.  Difficulty starting urine flow.  Decrease in size and strength of your urine stream.  Leaking (dribbling) after urinating.  Inability to pass urine. This needs immediate treatment.  Inability to completely empty your bladder.  Pain when you pass urine. This is more common if there is also an infection.  Urinary tract infection (UTI).  How is this diagnosed? This condition is diagnosed based on your medical history, a physical exam, and your symptoms. Tests will also be done, such as:  A post-void bladder scan. This measures any amount of urine that may remain in your bladder after you finish urinating.  A digital rectal exam. In a rectal exam, your health care provider checks your prostate by putting a lubricated, gloved finger into your rectum to feel the back of your prostate gland. This exam detects the size of your gland and any abnormal lumps or growths.  An exam of your urine (urinalysis).  A prostate specific antigen (PSA) screening. This is a blood test used to screen for prostate cancer.  An ultrasound. This test uses sound waves to electronically produce a picture of your prostate gland.  Your health care provider may refer you to a specialist in kidney and prostate diseases (urologist). How is this treated? Once symptoms begin, your health care provider will monitor your condition (active surveillance or watchful waiting). Treatment for this condition will depend on the severity of your condition. Treatment may include:  Observation and yearly exams. This may be the only treatment needed if your condition and symptoms are mild.  Medicines to relieve your symptoms, including: ? Medicines to shrink the prostate. ? Medicines to relax the muscle of the prostate.  Surgery in severe cases. Surgery may include: ? Prostatectomy.  In this procedure, the prostate tissue is removed completely through an open incision or with a laparascope or robotics. ? Transurethral resection of the prostate (TURP). In this procedure, a tool is inserted through the opening at the tip of the penis (urethra). It is used to cut away tissue of the inner core of the prostate. The pieces are removed through the same opening of the penis. This removes  the blockage. ? Transurethral incision (TUIP). In this procedure, small cuts are made in the prostate. This lessens the prostate's pressure on the urethra. ? Transurethral microwave thermotherapy (TUMT). This procedure uses microwaves to create heat. The heat destroys and removes a small amount of prostate tissue. ? Transurethral needle ablation (TUNA). This procedure uses radio frequencies to destroy and remove a small amount of prostate tissue. ? Interstitial laser coagulation (ILC). This procedure uses a laser to destroy and remove a small amount of prostate tissue. ? Transurethral electrovaporization (TUVP). This procedure uses electrodes to destroy and remove a small amount of prostate tissue. ? Prostatic urethral lift. This procedure inserts an implant to push the lobes of the prostate away from the urethra.  Follow these instructions at home:  Take over-the-counter and prescription medicines only as told by your health care provider.  Monitor your symptoms for any changes. Contact your health care provider with any changes.  Avoid drinking large amounts of liquid before going to bed or out in public.  Avoid or reduce how much caffeine or alcohol you drink.  Give yourself time when you urinate.  Keep all follow-up visits as told by your health care provider. This is important. Contact a health care provider if:  You have unexplained back pain.  Your symptoms do not get better with treatment.  You develop side effects from the medicine you are taking.  Your urine becomes very dark or  has a bad smell.  Your lower abdomen becomes distended and you have trouble passing your urine. Get help right away if:  You have a fever or chills.  You suddenly cannot urinate.  You feel lightheaded, or very dizzy, or you faint.  There are large amounts of blood or clots in the urine.  Your urinary problems become hard to manage.  You develop moderate to severe low back or flank pain. The flank is the side of your body between the ribs and the hip. These symptoms may represent a serious problem that is an emergency. Do not wait to see if the symptoms will go away. Get medical help right away. Call your local emergency services (911 in the [place] Do not drive yourself to the hospital. Summary  Benign prostatic hyperplasia (BPH) is an enlarged prostate that is caused by the normal aging process and not by cancer.  An enlarged prostate can press on the urethra. This can make it hard to pass urine.  This condition is part of a normal aging process and is more likely to develop in men over the age of 50 years.  Get help right away if you suddenly cannot urinate. This information is not intended to replace advice given to you by your health care provider. Make sure you discuss any questions you have with your health care provider. Document Released: 02/25/2005 Document Revised: 04/01/2016 Document Reviewed: 04/01/2016 Elsevier Interactive Patient Education  [employer].

## 2017-12-17 NOTE — Progress Notes (Signed)
Subjective:  Patient ID: Nathan Boyle, male    DOB: 1966-02-06  Age: 52 y.o. MRN: [id number]  CC: New Patient (Initial Visit)   HPI Nathan Boyle presents for a physical exam and follow-up of his B12 deficiency.  He enjoys good health is far as he knows.  He does not drink alcohol, smoke cigarettes or use illicit drugs.  He lives with his wife.  He has 2 grown children.  His son has graduated from college and works in Consulting civil engineer.  His daughter is at [employer] in [place].  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  With regards to urine flow he admits to nocturia x3.  He is currently not exercising on a regular basis.  He deals with sneezing drainage and nasal congestion in the fall each year.  This is responded well to Allegra-D.  He has regular dental checks.  Last colonoscopy was in 2018.  Outpatient Medications Prior to Visit  Medication Sig Dispense Refill  . fexofenadine-pseudoephedrine (ALLEGRA-D 12 HOUR) 60-120 MG per tablet Take 1 tablet by mouth 2 (two) times daily. (Patient taking differently: Take 1 tablet by mouth daily. ) 180 tablet 0  . loratadine (CLARITIN) 10 MG tablet Take 10 mg by mouth daily.    . Multiple Vitamins-Minerals (MENS ONE DAILY PO) Take by mouth.    . simvastatin (ZOCOR) 10 MG tablet TAKE 1 TABLET BY MOUTH EVERY NIGHT AT BEDTIME 90 tablet 1   No facility-administered medications prior to visit.     ROS Review of Systems  Constitutional: Negative for chills, fatigue, fever and unexpected weight change.  HENT: Positive for congestion, postnasal drip and sneezing.   Eyes: Negative for photophobia and visual disturbance.  Respiratory: Negative.   Cardiovascular: Negative.   Gastrointestinal: Negative.   Endocrine: Negative for polyphagia and polyuria.  Genitourinary: Negative for decreased urine volume, difficulty urinating and hematuria.  Musculoskeletal: Negative for gait problem and joint  swelling.  Skin: Negative for color change, pallor and rash.  Allergic/Immunologic: Negative for immunocompromised state.  Neurological: Negative for headaches.  Hematological: Does not bruise/bleed easily.  Psychiatric/Behavioral: Negative.     Objective:  BP 120/82 (BP Location: Left Arm, Patient Position: Sitting, Cuff Size: Large)   Pulse 76   Temp 99 F (37.2 C) (Oral)   Ht 5' 10.5" (1.791 m)   Wt 239 lb (108.4 kg)   SpO2 98%   BMI 33.81 kg/m   BP Readings from Last 3 Encounters:  12/17/17 120/82  06/06/16 118/78  10/08/15 113/74    Wt Readings from Last 3 Encounters:  12/17/17 239 lb (108.4 kg)  06/06/16 239 lb (108.4 kg)  02/06/15 229 lb 3.2 oz (104 kg)    Physical Exam  Constitutional: He is oriented to person, place, and time. He appears well-developed and well-nourished. No distress.  HENT:  Head: Normocephalic and atraumatic.  Right Ear: External ear normal.  Left Ear: External ear normal.  Mouth/Throat: Oropharynx is clear and moist. No oropharyngeal exudate.  Eyes: Pupils are equal, round, and reactive to light. Conjunctivae and EOM are normal. Right eye exhibits no discharge. Left eye exhibits no discharge.  Neck: Neck supple. No JVD present. No tracheal deviation present. No thyromegaly present.  Cardiovascular: Normal rate, regular rhythm and normal heart sounds.  Pulmonary/Chest: Effort normal and breath sounds normal.  Abdominal: Soft. Bowel sounds are normal. He exhibits no distension and no mass. There is no tenderness. There is  no rebound and no guarding.  Genitourinary: Rectal exam shows no external hemorrhoid, no internal hemorrhoid, no fissure, no mass, no tenderness, anal tone normal and guaiac negative stool. Prostate is enlarged. Prostate is not tender.  Musculoskeletal: He exhibits no edema.  Neurological: He is alert and oriented to person, place, and time.  Skin: Skin is warm and dry. No rash noted. He is not diaphoretic. No erythema. No  pallor.  Psychiatric: He has a normal mood and affect. His behavior is normal.    Lab Results  Component Value Date   WBC 6.6 10/08/2015   HGB 13.7 10/08/2015   HCT 41.9 10/08/2015   PLT 200 10/08/2015   GLUCOSE 101 (H) 10/08/2015   CHOL 209 (H) 06/06/2016   TRIG 120.0 06/06/2016   HDL 48.60 06/06/2016   LDLCALC 136 (H) 06/06/2016   ALT 19 10/25/2013   AST 19 10/25/2013   NA 140 10/08/2015   K 3.4 (L) 10/08/2015   CL 105 10/08/2015   CREATININE 0.93 10/08/2015   BUN 11 10/08/2015   CO2 24 10/08/2015   TSH 0.84 10/25/2013   PSA 0.36 06/06/2016   HGBA1C 4.8 06/06/2016    Dg Chest 2 View  Result Date: 10/08/2015 CLINICAL DATA:  Acute chest pain. EXAM: CHEST  2 VIEW COMPARISON:  None. FINDINGS: This is a low volume film. The cardiomediastinal silhouette is unremarkable. There is no evidence of focal airspace disease, pulmonary edema, suspicious pulmonary nodule/mass, pleural effusion, or pneumothorax. No acute bony abnormalities are identified. IMPRESSION: No active cardiopulmonary disease. Electronically Signed   By: Harmon Pier M.D.   On: 10/08/2015 15:30   Assessment & Plan:   Rashee was seen today for new patient (initial visit).  Diagnoses and all orders for this visit:  B12 deficiency -     Vitamin B12 -     CBC  Nocturia -     PSA -     Urinalysis, Routine w reflex microscopic  Encounter for health maintenance examination with abnormal findings -     CBC -     Comprehensive metabolic panel -     Lipid panel -     PSA -     Urinalysis, Routine w reflex microscopic   I am having [name] maintain his fexofenadine-pseudoephedrine, simvastatin, Multiple Vitamins-Minerals (MENS ONE DAILY PO), and loratadine.  No orders of the defined types were placed in this encounter.  Encouraged patient to start exercising for 30 minutes 5 days a week.  Suggested that he avoid all sweets he sodas and drinks.  He will avoid liquid intake 2 hours prior and prior to  going to bed at night.  Anticipatory guidance was given to him for health maintenance and disease prevention.  We will follow-up in 3 months  Follow-up: Return in about 3 months (around 03/19/2018).  Mliss Sax, MD

## 2018-03-03 ENCOUNTER — Other Ambulatory Visit: Payer: Self-pay | Admitting: Family Medicine

## 2018-03-10 ENCOUNTER — Other Ambulatory Visit: Payer: Self-pay | Admitting: Family Medicine

## 2018-07-07 ENCOUNTER — Telehealth: Payer: Self-pay | Admitting: Family Medicine

## 2018-07-07 NOTE — Telephone Encounter (Signed)
I called and left message on patient voicemail to call office and schedule follow up appointment with Dr. Kremer.  °

## 2018-10-14 ENCOUNTER — Other Ambulatory Visit: Payer: Self-pay | Admitting: Family Medicine

## 2019-08-05 NOTE — Patient Instructions (Addendum)
Health Maintenance Due  Topic Date Due  . COVID-19 Vaccine (1) Never done    Depression screen PHQ 2/9 06/06/2016  Decreased Interest 0  Down, Depressed, Hopeless 0  PHQ - 2 Score 0    Preventive Care 25-56 Years Old, Male Preventive care refers to lifestyle choices and visits with your health care provider that can promote health and wellness. This includes:  A yearly physical exam. This is also called an annual well check.  Regular dental and eye exams.  Immunizations.  Screening for certain conditions.  Healthy lifestyle choices, such as eating a healthy diet, getting regular exercise, not using drugs or products that contain nicotine and tobacco, and limiting alcohol use. What can I expect for my preventive care visit? Physical exam Your health care provider will check:  Height and weight. These may be used to calculate body mass index (BMI), which is a measurement that tells if you are at a healthy weight.  Heart rate and blood pressure.  Your skin for abnormal spots. Counseling Your health care provider may ask you questions about:  Alcohol, tobacco, and drug use.  Emotional well-being.  Home and relationship well-being.  Sexual activity.  Eating habits.  Work and work Statistician. What immunizations do I need?  Influenza (flu) vaccine  This is recommended every year. Tetanus, diphtheria, and pertussis (Tdap) vaccine  You may need a Td booster every 10 years. Varicella (chickenpox) vaccine  You may need this vaccine if you have not already been vaccinated. Zoster (shingles) vaccine  You may need this after age 76. Measles, mumps, and rubella (MMR) vaccine  You may need at least one dose of MMR if you were born in 1957 or later. You may also need a second dose. Pneumococcal conjugate (PCV13) vaccine  You may need this if you have certain conditions and were not previously vaccinated. Pneumococcal polysaccharide (PPSV23) vaccine  You may need one  or two doses if you smoke cigarettes or if you have certain conditions. Meningococcal conjugate (MenACWY) vaccine  You may need this if you have certain conditions. Hepatitis A vaccine  You may need this if you have certain conditions or if you travel or work in places where you may be exposed to hepatitis A. Hepatitis B vaccine  You may need this if you have certain conditions or if you travel or work in places where you may be exposed to hepatitis B. Haemophilus influenzae type b (Hib) vaccine  You may need this if you have certain risk factors. Human papillomavirus (HPV) vaccine  If recommended by your health care provider, you may need three doses over 6 months. You may receive vaccines as individual doses or as more than one vaccine together in one shot (combination vaccines). Talk with your health care provider about the risks and benefits of combination vaccines. What tests do I need? Blood tests  Lipid and cholesterol levels. These may be checked every 5 years, or more frequently if you are over 30 years old.  Hepatitis C test.  Hepatitis B test. Screening  Lung cancer screening. You may have this screening every year starting at age 56 if you have a 30-pack-year history of smoking and currently smoke or have quit within the past 15 years.  Prostate cancer screening. Recommendations will vary depending on your family history and other risks.  Colorectal cancer screening. All adults should have this screening starting at age 83 and continuing until age 11. Your health care provider may recommend screening at age 44  if you are at increased risk. You will have tests every 1-10 years, depending on your results and the type of screening test.  Diabetes screening. This is done by checking your blood sugar (glucose) after you have not eaten for a while (fasting). You may have this done every 1-3 years.  Sexually transmitted disease (STD) testing. Follow these instructions at  home: Eating and drinking  Eat a diet that includes fresh fruits and vegetables, whole grains, lean protein, and low-fat dairy products.  Take vitamin and mineral supplements as recommended by your health care provider.  Do not drink alcohol if your health care provider tells you not to drink.  If you drink alcohol: ? Limit how much you have to 0-2 drinks a day. ? Be aware of how much alcohol is in your drink. In the [place] one drink equals one 12 oz bottle of beer (355 mL), one 5 oz glass of wine (148 mL), or one 1 oz glass of hard liquor (44 mL). Lifestyle  Take daily care of your teeth and gums.  Stay active. Exercise for at least 30 minutes on 5 or more days each week.  Do not use any products that contain nicotine or tobacco, such as cigarettes, e-cigarettes, and chewing tobacco. If you need help quitting, ask your health care provider.  If you are sexually active, practice safe sex. Use a condom or other form of protection to prevent STIs (sexually transmitted infections).  Talk with your health care provider about taking a low-dose aspirin every day starting at age 44. What's next?  Go to your health care provider once a year for a well check visit.  Ask your health care provider how often you should have your eyes and teeth checked.  Stay up to date on all vaccines. This information is not intended to replace advice given to you by your health care provider. Make sure you discuss any questions you have with your health care provider. Document Revised: 02/19/2018 Document Reviewed: 02/19/2018 Elsevier Patient Education  2020 [place] Maintenance, Male Adopting a healthy lifestyle and getting preventive care are important in promoting health and wellness. Ask your health care provider about:  The right schedule for you to have regular tests and exams.  Things you can do on your own to prevent diseases and keep yourself healthy. What should I know about  diet, weight, and exercise? Eat a healthy diet   Eat a diet that includes plenty of vegetables, fruits, low-fat dairy products, and lean protein.  Do not eat a lot of foods that are high in solid fats, added sugars, or sodium. Maintain a healthy weight Body mass index (BMI) is a measurement that can be used to identify possible weight problems. It estimates body fat based on height and weight. Your health care provider can help determine your BMI and help you achieve or maintain a healthy weight. Get regular exercise Get regular exercise. This is one of the most important things you can do for your health. Most adults should:  Exercise for at least 150 minutes each week. The exercise should increase your heart rate and make you sweat (moderate-intensity exercise).  Do strengthening exercises at least twice a week. This is in addition to the moderate-intensity exercise.  Spend less time sitting. Even light physical activity can be beneficial. Watch cholesterol and blood lipids Have your blood tested for lipids and cholesterol at 56 years of age, then have this test every 5 years. You may need  to have your cholesterol levels checked more often if:  Your lipid or cholesterol levels are high.  You are older than 56 years of age.  You are at high risk for heart disease. What should I know about cancer screening? Many types of cancers can be detected early and may often be prevented. Depending on your health history and family history, you may need to have cancer screening at various ages. This may include screening for:  Colorectal cancer.  Prostate cancer.  Skin cancer.  Lung cancer. What should I know about heart disease, diabetes, and high blood pressure? Blood pressure and heart disease  High blood pressure causes heart disease and increases the risk of stroke. This is more likely to develop in people who have high blood pressure readings, are of African descent, or are  overweight.  Talk with your health care provider about your target blood pressure readings.  Have your blood pressure checked: ? Every 3-5 years if you are 2-58 years of age. ? Every year if you are 61 years old or older.  If you are between the ages of 52 and 45 and are a current or former smoker, ask your health care provider if you should have a one-time screening for abdominal aortic aneurysm (AAA). Diabetes Have regular diabetes screenings. This checks your fasting blood sugar level. Have the screening done:  Once every three years after age 92 if you are at a normal weight and have a low risk for diabetes.  More often and at a younger age if you are overweight or have a high risk for diabetes. What should I know about preventing infection? Hepatitis B If you have a higher risk for hepatitis B, you should be screened for this virus. Talk with your health care provider to find out if you are at risk for hepatitis B infection. Hepatitis C Blood testing is recommended for:  Everyone born from [birth year] through 1967.  Anyone with known risk factors for hepatitis C. Sexually transmitted infections (STIs)  You should be screened each year for STIs, including gonorrhea and chlamydia, if: ? You are sexually active and are younger than 56 years of age. ? You are older than 56 years of age and your health care provider tells you that you are at risk for this type of infection. ? Your sexual activity has changed since you were last screened, and you are at increased risk for chlamydia or gonorrhea. Ask your health care provider if you are at risk.  Ask your health care provider about whether you are at high risk for HIV. Your health care provider may recommend a prescription medicine to help prevent HIV infection. If you choose to take medicine to prevent HIV, you should first get tested for HIV. You should then be tested every 3 months for as long as you are taking the medicine. Follow these  instructions at home: Lifestyle  Do not use any products that contain nicotine or tobacco, such as cigarettes, e-cigarettes, and chewing tobacco. If you need help quitting, ask your health care provider.  Do not use street drugs.  Do not share needles.  Ask your health care provider for help if you need support or information about quitting drugs. Alcohol use  Do not drink alcohol if your health care provider tells you not to drink.  If you drink alcohol: ? Limit how much you have to 0-2 drinks a day. ? Be aware of how much alcohol is in your drink. In the  [place] one drink equals one 12 oz bottle of beer (355 mL), one 5 oz glass of wine (148 mL), or one 1 oz glass of hard liquor (44 mL). General instructions  Schedule regular health, dental, and eye exams.  Stay current with your vaccines.  Tell your health care provider if: ? You often feel depressed. ? You have ever been abused or do not feel safe at home. Summary  Adopting a healthy lifestyle and getting preventive care are important in promoting health and wellness.  Follow your health care provider's instructions about healthy diet, exercising, and getting tested or screened for diseases.  Follow your health care provider's instructions on monitoring your cholesterol and blood pressure. This information is not intended to replace advice given to you by your health care provider. Make sure you discuss any questions you have with your health care provider. Document Revised: 02/18/2018 Document Reviewed: 02/18/2018 Elsevier Patient Education  [place].  Insomnia Insomnia is a sleep disorder that makes it difficult to fall asleep or stay asleep. Insomnia can cause fatigue, low energy, difficulty concentrating, mood swings, and poor performance at work or school. There are three different ways to classify insomnia:  Difficulty falling asleep.  Difficulty staying asleep.  Waking up too early in the morning. Any  type of insomnia can be long-term (chronic) or short-term (acute). Both are common. Short-term insomnia usually lasts for three months or less. Chronic insomnia occurs at least three times a week for longer than three months. What are the causes? Insomnia may be caused by another condition, situation, or substance, such as:  Anxiety.  Certain medicines.  Gastroesophageal reflux disease (GERD) or other gastrointestinal conditions.  Asthma or other breathing conditions.  Restless legs syndrome, sleep apnea, or other sleep disorders.  Chronic pain.  Menopause.  Stroke.  Abuse of alcohol, tobacco, or illegal drugs.  Mental health conditions, such as depression.  Caffeine.  Neurological disorders, such as Alzheimer's disease.  An overactive thyroid (hyperthyroidism). Sometimes, the cause of insomnia may not be known. What increases the risk? Risk factors for insomnia include:  Gender. Women are affected more often than men.  Age. Insomnia is more common as you get older.  Stress.  Lack of exercise.  Irregular work schedule or working night shifts.  Traveling between different time zones.  Certain medical and mental health conditions. What are the signs or symptoms? If you have insomnia, the main symptom is having trouble falling asleep or having trouble staying asleep. This may lead to other symptoms, such as:  Feeling fatigued or having low energy.  Feeling nervous about going to sleep.  Not feeling rested in the morning.  Having trouble concentrating.  Feeling irritable, anxious, or depressed. How is this diagnosed? This condition may be diagnosed based on:  Your symptoms and medical history. Your health care provider may ask about: ? Your sleep habits. ? Any medical conditions you have. ? Your mental health.  A physical exam. How is this treated? Treatment for insomnia depends on the cause. Treatment may focus on treating an underlying condition that  is causing insomnia. Treatment may also include:  Medicines to help you sleep.  Counseling or therapy.  Lifestyle adjustments to help you sleep better. Follow these instructions at home: Eating and drinking   Limit or avoid alcohol, caffeinated beverages, and cigarettes, especially close to bedtime. These can disrupt your sleep.  Do not eat a large meal or eat spicy foods right before bedtime. This can lead to digestive  discomfort that can make it hard for you to sleep. Sleep habits   Keep a sleep diary to help you and your health care provider figure out what could be causing your insomnia. Write down: ? When you sleep. ? When you wake up during the night. ? How well you sleep. ? How rested you feel the next day. ? Any side effects of medicines you are taking. ? What you eat and drink.  Make your bedroom a dark, comfortable place where it is easy to fall asleep. ? Put up shades or blackout curtains to block light from outside. ? Use a white noise machine to block noise. ? Keep the temperature cool.  Limit screen use before bedtime. This includes: ? Watching TV. ? Using your smartphone, tablet, or computer.  Stick to a routine that includes going to bed and waking up at the same times every day and night. This can help you fall asleep faster. Consider making a quiet activity, such as reading, part of your nighttime routine.  Try to avoid taking naps during the day so that you sleep better at night.  Get out of bed if you are still awake after 15 minutes of trying to sleep. Keep the lights down, but try reading or doing a quiet activity. When you feel sleepy, go back to bed. General instructions  Take over-the-counter and prescription medicines only as told by your health care provider.  Exercise regularly, as told by your health care provider. Avoid exercise starting several hours before bedtime.  Use relaxation techniques to manage stress. Ask your health care provider  to suggest some techniques that may work well for you. These may include: ? Breathing exercises. ? Routines to release muscle tension. ? Visualizing peaceful scenes.  Make sure that you drive carefully. Avoid driving if you feel very sleepy.  Keep all follow-up visits as told by your health care provider. This is important. Contact a health care provider if:  You are tired throughout the day.  You have trouble in your daily routine due to sleepiness.  You continue to have sleep problems, or your sleep problems get worse. Get help right away if:  You have serious thoughts about hurting yourself or someone else. If you ever feel like you may hurt yourself or others, or have thoughts about taking your own life, get help right away. You can go to your nearest emergency department or call:  Your local emergency services (911 in the [place]  A suicide crisis helpline, such as the [place] at 604-098-1062. This is open 24 hours a day. Summary  Insomnia is a sleep disorder that makes it difficult to fall asleep or stay asleep.  Insomnia can be long-term (chronic) or short-term (acute).  Treatment for insomnia depends on the cause. Treatment may focus on treating an underlying condition that is causing insomnia.  Keep a sleep diary to help you and your health care provider figure out what could be causing your insomnia. This information is not intended to replace advice given to you by your health care provider. Make sure you discuss any questions you have with your health care provider. Document Revised: 02/07/2017 Document Reviewed: 12/05/2016 Elsevier Patient Education  2020 [employer].

## 2019-08-06 ENCOUNTER — Other Ambulatory Visit: Payer: Self-pay

## 2019-08-06 ENCOUNTER — Encounter: Payer: Self-pay | Admitting: Family Medicine

## 2019-08-06 ENCOUNTER — Ambulatory Visit (INDEPENDENT_AMBULATORY_CARE_PROVIDER_SITE_OTHER): Payer: 59 | Admitting: Family Medicine

## 2019-08-06 VITALS — BP 122/80 | HR 81 | Temp 97.0°F | Ht 71.5 in | Wt 217.2 lb

## 2019-08-06 DIAGNOSIS — E0590 Thyrotoxicosis, unspecified without thyrotoxic crisis or storm: Secondary | ICD-10-CM

## 2019-08-06 DIAGNOSIS — Z0000 Encounter for general adult medical examination without abnormal findings: Secondary | ICD-10-CM | POA: Insufficient documentation

## 2019-08-06 DIAGNOSIS — I493 Ventricular premature depolarization: Secondary | ICD-10-CM | POA: Diagnosis not present

## 2019-08-06 DIAGNOSIS — R8290 Unspecified abnormal findings in urine: Secondary | ICD-10-CM | POA: Diagnosis not present

## 2019-08-06 DIAGNOSIS — R748 Abnormal levels of other serum enzymes: Secondary | ICD-10-CM

## 2019-08-06 DIAGNOSIS — E7800 Pure hypercholesterolemia, unspecified: Secondary | ICD-10-CM

## 2019-08-06 LAB — LIPID PANEL
Cholesterol: 156 mg/dL (ref 0–200)
HDL: 49.1 mg/dL (ref >39.00)
LDL Cholesterol: 95 mg/dL (ref 0–99)
NonHDL: 106.75
Total CHOL/HDL Ratio: 3
Triglycerides: 61 mg/dL (ref 0.0–149.0)
VLDL: 12.2 mg/dL (ref 0.0–40.0)

## 2019-08-06 LAB — COMPREHENSIVE METABOLIC PANEL
ALT: 19 U/L (ref 0–53)
AST: 18 U/L (ref 0–37)
Albumin: 4.3 g/dL (ref 3.5–5.2)
Alkaline Phosphatase: 164 U/L — ABNORMAL HIGH (ref 39–117)
BUN: 13 mg/dL (ref 6–23)
CO2: 25 mEq/L (ref 19–32)
Calcium: 9.9 mg/dL (ref 8.4–10.5)
Chloride: 107 mEq/L (ref 96–112)
Creatinine, Ser: 0.77 mg/dL (ref 0.40–1.50)
GFR: 126.67 mL/min (ref >60.00)
Glucose, Bld: 111 mg/dL — ABNORMAL HIGH (ref 70–99)
Potassium: 4.1 mEq/L (ref 3.5–5.1)
Sodium: 139 mEq/L (ref 135–145)
Total Bilirubin: 2 mg/dL — ABNORMAL HIGH (ref 0.2–1.2)
Total Protein: 7 g/dL (ref 6.0–8.3)

## 2019-08-06 LAB — URINALYSIS, ROUTINE W REFLEX MICROSCOPIC
Hgb urine dipstick: NEGATIVE
Ketones, ur: NEGATIVE
Nitrite: NEGATIVE
Specific Gravity, Urine: 1.02 (ref 1.000–1.030)
Total Protein, Urine: NEGATIVE
Urine Glucose: NEGATIVE
Urobilinogen, UA: >=8 — AB (ref 0.0–1.0)
pH: 6.5 (ref 5.0–8.0)

## 2019-08-06 LAB — CBC
HCT: 41.5 % (ref 39.0–52.0)
Hemoglobin: 13.5 g/dL (ref 13.0–17.0)
MCHC: 32.6 g/dL (ref 30.0–36.0)
MCV: 87.7 fl (ref 78.0–100.0)
Platelets: 154 10*3/uL (ref 150.0–400.0)
RBC: 4.73 Mil/uL (ref 4.22–5.81)
RDW: 12.5 % (ref 11.5–15.5)
WBC: 6.2 10*3/uL (ref 4.0–10.5)

## 2019-08-06 LAB — PSA: PSA: 1.51 ng/mL (ref 0.10–4.00)

## 2019-08-06 LAB — LDL CHOLESTEROL, DIRECT: Direct LDL: 96 mg/dL

## 2019-08-06 MED ORDER — SIMVASTATIN 20 MG PO TABS: 20.0000 mg | ORAL_TABLET | Freq: Every day | ORAL | 3 refills | Status: DC

## 2019-08-06 NOTE — Progress Notes (Addendum)
Established Patient Office Visit  Subjective:  Patient ID: Nathan Boyle, male    DOB: 05-16-1965  Age: 54 y.o. MRN: [id number]  CC:  Chief Complaint  Patient presents with  . Annual Exam    Pt fasting for labs.  Pt would like discuss his sleeping habits.     HPI [name] presents for physical exam.  He is no longer taking the simvastatin since December.  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  He is seeing the dentist regularly.  Not exercising then steps he takes at work.  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  He has had some difficulty with sleep.  He did not start the tamsulosin.  Past Medical History:  Diagnosis Date  . Allergy   . Blood transfusion without reported diagnosis    thinks had transfusion with foot surgery 20 + years ago  . Eczema   . Hyperlipidemia    on medicines for this  . Seizures (HCC)    at age 68  . Ulcer    30 yrs ago- age [age] per pt.    Past Surgical History:  Procedure Laterality Date  . CARPAL TUNNEL RELEASE     carpal tunnel release right   . FOOT SURGERY     x2    Family History  Problem Relation Age of Onset  . Hyperlipidemia Mother   . Stroke Mother   . Heart murmur Mother   . Hypertension Mother   . Arthritis Sister   . Stroke Maternal Grandmother   . Rheum arthritis Sister   . Sickle cell trait Sister   . Colon cancer Neg Hx   . Rectal cancer Neg Hx   . Stomach cancer Neg Hx     Social History   Socioeconomic History  . Marital status: Married    Spouse name: Not on file  . Number of children: Not on file  . Years of education: Not on file  . Highest education level: Not on file  Occupational History  . Not on file  Tobacco Use  . Smoking status: Never Smoker  . Smokeless tobacco: Never Used  Substance and Sexual Activity  . Alcohol use: No    Alcohol/week: 0.0 standard drinks  . Drug use: No  . Sexual activity: Not on file  Other Topics Concern   . Not on file  Social History Narrative   Updated 02/06/15   Work: Actor      Lives with: wife and 2 children      Active at work, diet is good   Chemical engineer Strain:   . Difficulty of Paying Living Expenses:   Food Insecurity:   . Worried About Programme researcher, broadcasting/film/video in the Last Year:   . Barista in the Last Year:   Transportation Needs:   . Freight forwarder (Medical):   Marland Kitchen Lack of Transportation (Non-Medical):   Physical Activity:   . Days of Exercise per Week:   . Minutes of Exercise per Session:   Stress:   . Feeling of Stress :   Social Connections:   . Frequency of Communication with Friends and Family:   . Frequency of Social Gatherings with Friends and Family:   . Attends Religious Services:   . Active Member of Clubs or Organizations:   . Attends Banker Meetings:   Marland Kitchen Marital Status:  Intimate Partner Violence:   . Fear of Current or Ex-Partner:   . Emotionally Abused:   Marland Kitchen Physically Abused:   . Sexually Abused:     Outpatient Medications Prior to Visit  Medication Sig Dispense Refill  . fexofenadine-pseudoephedrine (ALLEGRA-D 12 HOUR) 60-120 MG per tablet Take 1 tablet by mouth 2 (two) times daily. (Patient taking differently: Take 1 tablet by mouth daily. ) 180 tablet 0  . loratadine (CLARITIN) 10 MG tablet Take 10 mg by mouth daily.    . Multiple Vitamins-Minerals (MENS ONE DAILY PO) Take by mouth.    . simvastatin (ZOCOR) 10 MG tablet Take 1qhs (plz sched visit within 30 days) (Patient not taking: Reported on 08/06/2019) 30 tablet 0   No facility-administered medications prior to visit.    Allergies  Allergen Reactions  . Penicillins Hives    Has patient had a PCN reaction causing immediate rash, facial/tongue/throat swelling, SOB or lightheadedness with hypotension: Yes Has patient had a PCN reaction causing severe rash involving mucus membranes or skin necrosis:  No Has patient had a PCN reaction that required hospitalization No Has patient had a PCN reaction occurring within the last 10 years: No If all of the above answers are "NO", then may proceed with Cephalosporin use.    ROS Review of Systems  Constitutional: Negative for diaphoresis, fatigue, fever and unexpected weight change.  HENT: Negative.   Eyes: Negative for photophobia and visual disturbance.  Respiratory: Negative for chest tightness, shortness of breath and wheezing.   Cardiovascular: Negative.   Gastrointestinal: Negative.   Endocrine: Negative for polyphagia and polyuria.  Genitourinary: Negative for difficulty urinating, frequency and urgency.  Musculoskeletal: Negative for gait problem and joint swelling.  Skin: Negative for pallor and rash.  Allergic/Immunologic: Negative for immunocompromised state.  Neurological: Negative for tremors, speech difficulty and light-headedness.  Hematological: Does not bruise/bleed easily.  Psychiatric/Behavioral: Positive for sleep disturbance.   Depression screen [facility] 2/9 08/06/2019 06/06/2016  Decreased Interest 0 0  Down, Depressed, Hopeless 0 0  PHQ - 2 Score 0 0      Objective:    Physical Exam  Constitutional: He appears well-developed and well-nourished. No distress.  HENT:  Head: Normocephalic and atraumatic.  Right Ear: External ear normal.  Left Ear: External ear normal.  Eyes: Conjunctivae are normal. Right eye exhibits no discharge. Left eye exhibits no discharge. No scleral icterus.  Neck: No JVD present. No tracheal deviation present. No thyromegaly present.  Cardiovascular: Normal rate and normal heart sounds.  Extrasystoles are present.  Pulmonary/Chest: Effort normal and breath sounds normal. Stridor present.  Abdominal: Soft. Normal appearance and bowel sounds are normal. There is no hepatosplenomegaly, splenomegaly or hepatomegaly. There is no abdominal tenderness. There is no rebound. A hernia is present. Hernia  confirmed positive in the ventral area.  Genitourinary: Rectum:     Guaiac result negative.     No rectal mass, anal fissure, tenderness, external hemorrhoid, internal hemorrhoid or abnormal anal tone.  Prostate is enlarged. Prostate is not tender.  Lymphadenopathy:    He has no cervical adenopathy.  Skin: Skin is warm, dry and intact. He is not diaphoretic.  Psychiatric: He has a normal mood and affect. His behavior is normal.    BP 122/80 (BP Location: Left Arm, Patient Position: Sitting, Cuff Size: Normal)   Pulse 81   Temp (!) 97 F (36.1 C) (Temporal)   Ht 5' 11.5" (1.816 m)   Wt 217 lb 3.2 oz (98.5 kg)   SpO2 99%  BMI 29.87 kg/m  Wt Readings from Last 3 Encounters:  08/06/19 217 lb 3.2 oz (98.5 kg)  12/17/17 239 lb (108.4 kg)  06/06/16 239 lb (108.4 kg)     There are no preventive care reminders to display for this patient.  There are no preventive care reminders to display for this patient.  Lab Results  Component Value Date   TSH 0.84 10/25/2013   Lab Results  Component Value Date   WBC 6.2 08/06/2019   HGB 13.5 08/06/2019   HCT 41.5 08/06/2019   MCV 87.7 08/06/2019   PLT 154.0 08/06/2019   Lab Results  Component Value Date   NA 139 08/06/2019   K 4.1 08/06/2019   CO2 25 08/06/2019   GLUCOSE 111 (H) 08/06/2019   BUN 13 08/06/2019   CREATININE 0.77 08/06/2019   BILITOT 2.0 (H) 08/06/2019   ALKPHOS 164 (H) 08/06/2019   AST 18 08/06/2019   ALT 19 08/06/2019   PROT 7.0 08/06/2019   ALBUMIN 4.3 08/06/2019   CALCIUM 9.9 08/06/2019   ANIONGAP 11 10/08/2015   GFR 126.67 08/06/2019   Lab Results  Component Value Date   CHOL 156 08/06/2019   Lab Results  Component Value Date   HDL 49.10 08/06/2019   Lab Results  Component Value Date   LDLCALC 95 08/06/2019   Lab Results  Component Value Date   TRIG 61.0 08/06/2019   Lab Results  Component Value Date   CHOLHDL 3 08/06/2019   Lab Results  Component Value Date   HGBA1C 4.8 06/06/2016    The 10-year ASCVD risk score Denman George DC Jr., et al., 2013) is: 5.7%   Values used to calculate the score:     Age: 72 years     Sex: Male     Is Non-Hispanic African American: Yes     Diabetic: No     Tobacco smoker: No     Systolic Blood Pressure: 122 mmHg     Is BP treated: No     HDL Cholesterol: 49.1 mg/dL     Total Cholesterol: 156 mg/dL   Assessment & Plan:   Problem List Items Addressed This Visit      Cardiovascular and Mediastinum   Ventricular ectopy   Relevant Medications   simvastatin (ZOCOR) 20 MG tablet   Other Relevant Orders   Ambulatory referral to Cardiology     Other   Elevated cholesterol   Relevant Medications   simvastatin (ZOCOR) 20 MG tablet   Healthcare maintenance - Primary   Relevant Orders   CBC (Completed)   Comprehensive metabolic panel (Completed)   LDL cholesterol, direct (Completed)   Lipid panel (Completed)   PSA (Completed)   Urinalysis, Routine w reflex microscopic (Completed)   Elevated alkaline phosphatase level   Relevant Orders   Alkaline Phosphatase Isoenzymes   Abnormal urine   Relevant Orders   Urinalysis, Routine w reflex microscopic   Urine Culture   Urine cytology ancillary only   HIV antibody (with reflex)      Meds ordered this encounter  Medications  . simvastatin (ZOCOR) 20 MG tablet    Sig: Take 1 tablet (20 mg total) by mouth at bedtime.    Dispense:  90 tablet    Refill:  3    Follow-up: Return in about 3 months (around 11/06/2019), or if symptoms worsen or fail to improve.   Patient was given information on health maintenance and disease prevention as well as sleep hygiene.  Fasting labs drawn today.  Patient will cut back on the caffeine that he has been consuming.  He will start an exercise program walking for 30 minutes 5 days weekly. Mliss Sax, MD

## 2019-08-06 NOTE — Addendum Note (Signed)
Addended by: Andrez Grime on: 08/06/2019 03:01 PM   Modules accepted: Orders

## 2019-08-11 ENCOUNTER — Telehealth: Payer: Self-pay | Admitting: Family Medicine

## 2019-08-11 NOTE — Telephone Encounter (Signed)
Patient's wife called and stated at his last visit, Dr. Doreene Burke also discussed calling in an RX for nighttime frequent urination. That medicine is not at the pharmacy. Please call in something or call the patient's wife back to discuss.

## 2019-08-12 NOTE — Telephone Encounter (Signed)
Patient is returning the call. CB is [phone]

## 2019-08-12 NOTE — Telephone Encounter (Signed)
LMOVM for pt to RTC to speak with me/thx dmf

## 2019-08-12 NOTE — Telephone Encounter (Signed)
LMOVM for pt to RTC/thx dmf 

## 2019-08-12 NOTE — Telephone Encounter (Signed)
Patient refused treatment for now. He is going to decrease caffeine. If he wants medicine, he needs to be the person to call and not his wife.

## 2019-08-13 NOTE — Telephone Encounter (Signed)
Patient states that he will try as discussed and will call back if it has not helped in a month/I also discussed his labs with him/He will call back to schedule a lab visit for a repeat U/A and Alk Phos when hydrated well/thx dmf

## 2019-08-20 ENCOUNTER — Other Ambulatory Visit: Payer: Self-pay

## 2019-08-20 ENCOUNTER — Ambulatory Visit (INDEPENDENT_AMBULATORY_CARE_PROVIDER_SITE_OTHER): Payer: 59 | Admitting: Internal Medicine

## 2019-08-20 ENCOUNTER — Encounter: Payer: Self-pay | Admitting: Internal Medicine

## 2019-08-20 VITALS — BP 144/84 | HR 111 | Ht 72.0 in | Wt 220.8 lb

## 2019-08-20 DIAGNOSIS — Z8616 Personal history of COVID-19: Secondary | ICD-10-CM

## 2019-08-20 DIAGNOSIS — R0683 Snoring: Secondary | ICD-10-CM

## 2019-08-20 DIAGNOSIS — I493 Ventricular premature depolarization: Secondary | ICD-10-CM | POA: Diagnosis not present

## 2019-08-20 DIAGNOSIS — E785 Hyperlipidemia, unspecified: Secondary | ICD-10-CM

## 2019-08-20 DIAGNOSIS — I4891 Unspecified atrial fibrillation: Secondary | ICD-10-CM | POA: Diagnosis not present

## 2019-08-20 LAB — TSH: TSH: <0.005 u[IU]/mL — ABNORMAL LOW (ref 0.450–4.500)

## 2019-08-20 MED ORDER — METOPROLOL TARTRATE 25 MG PO TABS: 25.0000 mg | ORAL_TABLET | Freq: Two times a day (BID) | ORAL | 4 refills | Status: DC

## 2019-08-20 NOTE — Progress Notes (Signed)
Cardiology Office Note:    Date:  08/20/2019   ID:  Nathan Boyle, DOB November 26, 1965, MRN [id number]  PCP:  Mliss Sax, MD  Cardiologist:  Parke Poisson, MD  Electrophysiologist:  None   Referring MD: Mliss Sax,*   Chief Complaint: "ventricular ectopy"  History of Present Illness:    Nathan Boyle is a 54 y.o. male with a history of hyperlipidemia, seizures at age 68 and report of ventricular ectopy who presents for cardiovascular evaluation.   He has been under a tremendous amount of stress with work in the Astr[employer].  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  With COVID-19 work has become increasingly stressful, and he has been unable to exercise the way that he would like to.  His daughter has come home after graduating college and has started a walking program for her family.  I have encouraged him to participate in this.  He denies a history of chest pain or pressure, but does note a sensation of palpitations when lying flat at night and with decongestant use or sleep aid use.  He has recently been taking melatonin because he has very poor quality sleep but does not feel that it helps.  He notes that he likely sleeps only 3 to 4 hours a night.  He has been told by his wife that he snores.  He does not know his father's family history.  Notes that his mother has heart issues but is unclear what these are.   Past Medical History:  Diagnosis Date  . Allergy   . Blood transfusion without reported diagnosis    thinks had transfusion with foot surgery 20 + years ago  . Eczema   . Hyperlipidemia    on medicines for this  . Seizures (HCC)    at age 51  . Ulcer    30 yrs ago- age [age] per pt.    Past Surgical History:  Procedure Laterality Date  . CARPAL TUNNEL RELEASE     carpal tunnel release right   .  FOOT SURGERY     x2    Current Medications: Current Meds  Medication Sig  . fexofenadine-pseudoephedrine (ALLEGRA-D 12 HOUR) 60-120 MG per tablet Take 1 tablet by mouth 2 (two) times daily. (Patient taking differently: Take 1 tablet by mouth daily. )  . loratadine (CLARITIN) 10 MG tablet Take 10 mg by mouth daily.  . Multiple Vitamins-Minerals (MENS ONE DAILY PO) Take by mouth.  . simvastatin (ZOCOR) 20 MG tablet Take 1 tablet (20 mg total) by mouth at bedtime.     Allergies:   Penicillins   Social History   Socioeconomic History  . Marital status: Married    Spouse name: Not on file  . Number of children: Not on file  . Years of education: Not on file  . Highest education level: Not on file  Occupational History  . Not on file  Tobacco Use  . Smoking status: Never Smoker  . Smokeless tobacco: Never Used  Substance and Sexual Activity  . Alcohol use: No    Alcohol/week: 0.0 standard drinks  . Drug use: No  . Sexual activity: Not on file  Other Topics Concern  . Not on file  Social History Narrative   Updated 02/06/15   Work: Printmaker - [employer]  Lives with: wife and 2 children      Active at work, diet is good   Chemical engineer Strain:   . Difficulty of Paying Living Expenses:   Food Insecurity:   . Worried About Programme researcher, broadcasting/film/video in the Last Year:   . Barista in the Last Year:   Transportation Needs:   . Freight forwarder (Medical):   Marland Kitchen Lack of Transportation (Non-Medical):   Physical Activity:   . Days of Exercise per Week:   . Minutes of Exercise per Session:   Stress:   . Feeling of Stress :   Social Connections:   . Frequency of Communication with Friends and Family:   . Frequency of Social Gatherings with Friends and Family:   . Attends Religious Services:   . Active Member of Clubs or Organizations:   . Attends Banker Meetings:   Marland Kitchen Marital Status:      Family  History: The patient's family history includes Arthritis in his sister; Heart murmur in his mother; Hyperlipidemia in his mother; Hypertension in his mother; Rheum arthritis in his sister; Sickle cell trait in his sister; Stroke in his maternal grandmother and mother. There is no history of Colon cancer, Rectal cancer, or Stomach cancer.  ROS:   Please see the history of present illness.    All other systems reviewed and are negative.  EKGs/Labs/Other Studies Reviewed:    The following studies were reviewed today:  EKG:  Atrial fibrillation, rate 115 bpm  Recent Labs: 08/06/2019: ALT 19; BUN 13; Creatinine, Ser 0.77; Hemoglobin 13.5; Platelets 154.0; Potassium 4.1; Sodium 139  Recent Lipid Panel    Component Value Date/Time   CHOL 156 08/06/2019 0846   TRIG 61.0 08/06/2019 0846   HDL 49.10 08/06/2019 0846   CHOLHDL 3 08/06/2019 0846   VLDL 12.2 08/06/2019 0846   LDLCALC 95 08/06/2019 0846   LDLDIRECT 96.0 08/06/2019 0846    Physical Exam:    VS:  BP (!) 144/84   Pulse (!) 111   Ht 6' (1.829 m)   Wt 220 lb 12.8 oz (100.2 kg)   BMI 29.95 kg/m     Wt Readings from Last 5 Encounters:  08/20/19 220 lb 12.8 oz (100.2 kg)  08/06/19 217 lb 3.2 oz (98.5 kg)  12/17/17 239 lb (108.4 kg)  06/06/16 239 lb (108.4 kg)  02/06/15 229 lb 3.2 oz (104 kg)     Constitutional: No acute distress Eyes: sclera non-icteric, normal conjunctiva and lids ENMT: normal dentition, moist mucous membranes Cardiovascular: irregular rhythm, tachycardic rate, no murmurs. S1 and S2 normal. Radial pulses normal bilaterally. No jugular venous distention.  Respiratory: clear to auscultation bilaterally GI : normal bowel sounds, soft and nontender. No distention.   MSK: extremities warm, well perfused. No edema.  NEURO: grossly nonfocal exam, moves all extremities. PSYCH: alert and oriented x 3, normal mood and affect.   ASSESSMENT:    1. Atrial fibrillation, unspecified type (HCC)   2. Ventricular  ectopy   3. Hyperlipidemia, unspecified hyperlipidemia type   4. History of COVID-19   5. Snoring    PLAN:    Atrial fibrillation, unspecified type (HCC) - Plan: EKG 12-Lead, TSH, ECHOCARDIOGRAM COMPLETE, Split night study  He has a new diagnosis of atrial fibrillation, we have discussed this in great detail today including natural history of A. fib, rate, rhythm, stroke prevention strategies, and risk factors for developing and maintaining atrial fibrillation.  His chads vas score is currently 0, we will discuss anticoagulation in the future as needed and I have described this for the patient.  His rate is mildly elevated right now as is his blood pressure, we will initiate metoprolol tartrate 25 mg twice daily and titrate to effect.  For atrial fibrillation he will require an echocardiogram to evaluate structure and function as well as atrial size.  He snores and will need a sleep study.  We will also obtain a 2-week cardiac event monitor to evaluate for burden of atrial fibrillation.  If upon follow-up he is still in atrial fibrillation we will discuss potential TEE cardioversion or anticoagulation with cardioversion.  We will also obtain a TSH today.  Ventricular ectopy - Plan: EKG 12-Lead While we are obtaining a repeat ECG to confirm a diagnosis of atrial fibrillation, I did witness 1 ventricular ectopic beat on the telemetry screen on the ECG machine.  We will evaluate this further with a cardiac event monitor which is being obtained primarily for atrial fibrillation.  Hyperlipidemia, unspecified hyperlipidemia type-patient has recently restarted simvastatin, and lipid panel is improving.  Continue to follow.  History of COVID-19 - Plan: ECHOCARDIOGRAM COMPLETE He believes he had COVID-19 infection late in 2019 and is questioning whether any heart damage has occurred.  We discussed recent studies suggesting no increased incidence of significant myocardial dysfunction as a result of mild  COVID-19 infections, however we will obtain the echocardiogram for structure and function.  Snoring - Plan: Split night study   Nathan Kaiser, MD [place]  [facility]    Medication Adjustments/Labs and Tests Ordered: Current medicines are reviewed at length with the patient today.  Concerns regarding medicines are outlined above.  Orders Placed This Encounter  Procedures  . TSH  . EKG 12-Lead  . ECHOCARDIOGRAM COMPLETE  . Split night study   Meds ordered this encounter  Medications  . metoprolol tartrate (LOPRESSOR) 25 MG tablet    Sig: Take 1 tablet (25 mg total) by mouth 2 (two) times daily.    Dispense:  60 tablet    Refill:  4    Patient Instructions  Medication Instructions:  Metoprolol Tartrate 25mg  Twice Daily *If you need a refill on your cardiac medications before your next appointment, please call your pharmacy*   Lab Work: TSH If you have labs (blood work) drawn today and your tests are completely normal, you will receive your results only by: Marland Kitchen MyChart Message (if you have MyChart) OR . A paper copy in the mail If you have any lab test that is abnormal or we need to change your treatment, we will call you to review the results.   Testing/Procedures: [facility] has requested that you have an echocardiogram. Echocardiography is a painless test that uses sound waves to create images of your heart. It provides your doctor with information about the size and shape of your heart and how well your heart's chambers and valves are working. This procedure takes approximately one hour. There are no restrictions for this procedure.  And   [employer] after authorization from [facility]. Your physician has recommended that you have a sleep study. This test records several body functions during sleep, including: brain activity, eye movement, oxygen and carbon dioxide blood levels, heart rate and rhythm,  breathing rate and rhythm, the flow of air through your mouth and nose, snoring, body muscle movements, and chest and belly movement.  Your physician has recommended that you wear an event monitor. Event monitors are medical devices that record the heart's electrical activity. Doctors most often [place] these monitors to diagnose arrhythmias. Arrhythmias are problems with the speed or rhythm of the heartbeat. The monitor is a small, portable device. You can wear one while you do your normal daily activities. This is usually used to diagnose what is causing palpitations/syncope (passing out).  Your physician has recommended that you wear a  14 DAY ZIO-PATCH monitor. The Zio patch cardiac monitor continuously records heart rhythm data for up to 14 days, this is for patients being evaluated for multiple types heart rhythms. For the first 24 hours post application, please avoid getting the Zio monitor wet in the shower or by excessive sweating during exercise. After that, feel free to carry on with regular activities. Keep soaps and lotions away from the ZIO XT Patch.  This will be mailed to you, please expect 7-10 days to receive.   Nathan Boyle location - [address], [address].         Follow-Up: At [facility], you and your health needs are our priority.  As part of our continuing mission to provide you with exceptional heart care, we have created designated Provider Care Teams.  These Care Teams include your primary Cardiologist (physician) and Advanced Practice Providers (APPs -  Physician Assistants and Nurse Practitioners) who all work together to provide you with the care you need, when you need it.  We recommend signing up for the patient portal called "MyChart".  Sign up information is provided on this After Visit Summary.  MyChart is used to connect with patients for Virtual Visits (Telemedicine).  Patients are able to view lab/test results, encounter notes, upcoming appointments, etc.   Non-urgent messages can be sent to your provider as well.   To learn more about what you can do with MyChart, go to [email or web address].    Your next appointment:   1 month(s)  The format for your next appointment:   In Person  Provider:   Weston Brass, MD   Other Instructions    Nathan Boyle- Long Term Monitor Instructions   Your physician has requested you wear your ZIO patch monitor__14_____days.   This is a single patch monitor.  [employer] supplies one patch monitor per enrollment.  Additional stickers are not available.   Please do not apply patch if you will be having a Nuclear Stress Test, Echocardiogram, Cardiac CT, MRI, or Chest Xray during the time frame you would be wearing the monitor. The patch cannot be worn during these tests.  You cannot remove and re-apply the ZIO XT patch monitor.   Your ZIO patch monitor will be sent USPS Priority mail from [facility] directly to your home address. The monitor may also be mailed to a PO BOX if home delivery is not available.   It may take 3-5 days to receive your monitor after you have been enrolled.   Once you have received you monitor, please review enclosed instructions.  Your monitor has already been registered assigning a specific monitor serial # to you.   Applying the monitor   Shave hair from upper left chest.   Hold abrader disc by orange tab.  Rub abrader in 40 strokes over left upper chest as indicated in your monitor instructions.   Clean area with 4 enclosed alcohol pads .  Use all pads to assure are is cleaned thoroughly.  Let dry.   Apply  patch as indicated in monitor instructions.  Patch will be place under collarbone on left side of chest with arrow pointing upward.   Rub patch adhesive wings for 2 minutes.Remove white label marked "1".  Remove white label marked "2".  Rub patch adhesive wings for 2 additional minutes.   While looking in a mirror, press and release button in center of patch.  A  small green light will flash 3-4 times .  This will be your only indicator the monitor has been turned on.     Do not shower for the first 24 hours.  You may shower after the first 24 hours.   Press button if you feel a symptom. You will hear a small click.  Record Date, Time and Symptom in the Patient Log Book.   When you are ready to remove patch, follow instructions on last 2 pages of Patient Log Book.  Stick patch monitor onto last page of Patient Log Book.   Place Patient Log Book in [place] box.  Use locking tab on box and tape box closed securely.  The [employer] and [employer] has [employer] on it.  Please place in mailbox as soon as possible.  Your physician should have your test results approximately 7 days after the monitor has been mailed back to [facility].   Call [facility] Customer Care at [phone] if you have questions regarding your ZIO XT patch monitor.  Call them immediately if you see an orange light blinking on your monitor.   If your monitor falls off in less than 4 days contact our Monitor department at [phone].  If your monitor becomes loose or falls off after 4 days call [employer] at [phone] for suggestions on securing your monitor.    Atrial Fibrillation  Atrial fibrillation is a type of heartbeat that is irregular or fast. If you have this condition, your heart beats without any order. This makes it hard for your heart to pump blood in a normal way. Atrial fibrillation may come and go, or it may become a long-lasting problem. If this condition is not treated, it can put you at higher risk for stroke, heart failure, and other heart problems. What are the causes? This condition may be caused by diseases that damage the heart. They include:  High blood pressure.  Heart failure.  Heart valve disease.  Heart surgery. Other causes include:  Diabetes.  Thyroid disease.  Being overweight.  Kidney disease. Sometimes the cause is not  known. What increases the risk? You are more likely to develop this condition if:  You are older.  You smoke.  You exercise often and very hard.  You have a family history of this condition.  You are a man.  You use drugs.  You drink a lot of alcohol.  You have lung conditions, such as emphysema, pneumonia, or COPD.  You have sleep apnea. What are the signs or symptoms? Common symptoms of this condition include:  A feeling that your heart is beating very fast.  Chest pain or discomfort.  Feeling short of breath.  Suddenly feeling light-headed or weak.  Getting tired easily during activity.  Fainting.  Sweating. In some cases, there are no symptoms. How is this treated? Treatment for this condition depends on underlying conditions and how you feel when you have atrial fibrillation. They include:  Medicines to: ? Prevent blood clots. ? Treat heart rate or heart rhythm problems.  Using devices, such as a pacemaker, to correct heart rhythm  problems.  Doing surgery to remove the part of the heart that sends bad signals.  Closing an area where clots can form in the heart (left atrial appendage). In some cases, your doctor will treat other underlying conditions. Follow these instructions at home: Medicines  Take over-the-counter and prescription medicines only as told by your doctor.  Do not take any new medicines without first talking to your doctor.  If you are taking blood thinners: ? Talk with your doctor before you take any medicines that have aspirin or NSAIDs, such as ibuprofen, in them. ? Take your medicine exactly as told by your doctor. Take it at the same time each day. ? Avoid activities that could hurt or bruise you. Follow instructions about how to prevent falls. ? Wear a bracelet that says you are taking blood thinners. Or, carry a card that lists what medicines you take. Lifestyle      Do not use any products that have nicotine or tobacco  in them. These include cigarettes, e-cigarettes, and chewing tobacco. If you need help quitting, ask your doctor.  Eat heart-healthy foods. Talk with your doctor about the right eating plan for you.  Exercise regularly as told by your doctor.  Do not drink alcohol.  Lose weight if you are overweight.  Do not use drugs, including cannabis. General instructions  If you have a condition that causes breathing to stop for a short period of time (apnea), treat it as told by your doctor.  Keep a healthy weight. Do not use diet pills unless your doctor says they are safe for you. Diet pills may make heart problems worse.  Keep all follow-up visits as told by your doctor. This is important. Contact a doctor if:  You notice a change in the speed, rhythm, or strength of your heartbeat.  You are taking a blood-thinning medicine and you get more bruising.  You get tired more easily when you move or exercise.  You have a sudden change in weight. Get help right away if:   You have pain in your chest or your belly (abdomen).  You have trouble breathing.  You have side effects of blood thinners, such as blood in your vomit, poop (stool), or pee (urine), or bleeding that cannot stop.  You have any signs of a stroke. "BE FAST" is an easy way to remember the main warning signs: ? B - Balance. Signs are dizziness, sudden trouble walking, or loss of balance. ? E - Eyes. Signs are trouble seeing or a change in how you see. ? F - Face. Signs are sudden weakness or loss of feeling in the face, or the face or eyelid drooping on one side. ? A - Arms. Signs are weakness or loss of feeling in an arm. This happens suddenly and usually on one side of the body. ? S - Speech. Signs are sudden trouble speaking, slurred speech, or trouble understanding what people say. ? T - Time. Time to call emergency services. Write down what time symptoms started.  You have other signs of a stroke, such as: ? A sudden,  very bad headache with no known cause. ? Feeling like you may vomit (nausea). ? Vomiting. ? A seizure. These symptoms may be an emergency. Do not wait to see if the symptoms will go away. Get medical help right away. Call your local emergency services (911 in the [place] Do not drive yourself to the hospital. Summary  Atrial fibrillation is a type of heartbeat that  is irregular or fast.  You are at higher risk of this condition if you smoke, are older, have diabetes, or are overweight.  Follow your doctor's instructions about medicines, diet, exercise, and follow-up visits.  Get help right away if you have signs or symptoms of a stroke.  Get help right away if you cannot catch your breath, or you have chest pain or discomfort. This information is not intended to replace advice given to you by your health care provider. Make sure you discuss any questions you have with your health care provider. Document Revised: 08/19/2018 Document Reviewed: 08/19/2018 Elsevier Patient Education  2020 [employer].

## 2019-08-20 NOTE — Patient Instructions (Addendum)
Medication Instructions:  Metoprolol Tartrate 25mg  Twice Daily *If you need a refill on your cardiac medications before your next appointment, please call your pharmacy*   Lab Work: TSH If you have labs (blood work) drawn today and your tests are completely normal, you will receive your results only by: MyChart Message (if you have MyChart) OR . A paper copy in the mail If you have any lab test that is abnormal or we need to change your treatment, we will call you to review the results.   Testing/Procedures: [address] Your physician has requested that you have an echocardiogram. Echocardiography is a painless test that uses sound waves to create images of your heart. It provides your doctor with information about the size and shape of your heart and how well your heart's chambers and valves are working. This procedure takes approximately one hour. There are no restrictions for this procedure.  And   [place] after authorization from [employer] Your physician has recommended that you have a sleep study. This test records several body functions during sleep, including: brain activity, eye movement, oxygen and carbon dioxide blood levels, heart rate and rhythm, breathing rate and rhythm, the flow of air through your mouth and nose, snoring, body muscle movements, and chest and belly movement.    Your physician has recommended that you wear an event monitor. Event monitors are medical devices that record the heart's electrical activity. Doctors most often [employer] these monitors to diagnose arrhythmias. Arrhythmias are problems with the speed or rhythm of the heartbeat. The monitor is a small, portable device. You can wear one while you do your normal daily activities. This is usually used to diagnose what is causing palpitations/syncope (passing out).  Your physician has recommended that you wear a  14 DAY ZIO-PATCH monitor. The Zio patch cardiac  monitor continuously records heart rhythm data for up to 14 days, this is for patients being evaluated for multiple types heart rhythms. For the first 24 hours post application, please avoid getting the Zio monitor wet in the shower or by excessive sweating during exercise. After that, feel free to carry on with regular activities. Keep soaps and lotions away from the ZIO XT Patch.  This will be mailed to you, please expect 7-10 days to receive.   [place] location - [address], [address].         Follow-Up: At [facility], you and your health needs are our priority.  As part of our continuing mission to provide you with exceptional heart care, we have created designated Provider Care Teams.  These Care Teams include your primary Cardiologist (physician) and Advanced Practice Providers (APPs -  Physician Assistants and Nurse Practitioners) who all work together to provide you with the care you need, when you need it.  We recommend signing up for the patient portal called "MyChart".  Sign up information is provided on this After Visit Summary.  MyChart is used to connect with patients for Virtual Visits (Telemedicine).  Patients are able to view lab/test results, encounter notes, upcoming appointments, etc.  Non-urgent messages can be sent to your provider as well.   To learn more about what you can do with MyChart, go to [facility].    Your next appointment:   1 month(s)  The format for your next appointment:   In Person  Provider:   [email or web address], MD   Other Instructions    ZIO XT- Long  Term Monitor Instructions   Your physician has requested you wear your ZIO patch monitor__14_____days.   This is a single patch monitor.  [employer] supplies one patch monitor per enrollment.  Additional stickers are not available.   Please do not apply patch if you will be having a Nuclear Stress Test, Echocardiogram, Cardiac CT, MRI, or Chest Xray during the time frame you  would be wearing the monitor. The patch cannot be worn during these tests.  You cannot remove and re-apply the ZIO XT patch monitor.   Your ZIO patch monitor will be sent USPS Priority mail from [facility] directly to your home address. The monitor may also be mailed to a PO BOX if home delivery is not available.   It may take 3-5 days to receive your monitor after you have been enrolled.   Once you have received you monitor, please review enclosed instructions.  Your monitor has already been registered assigning a specific monitor serial # to you.   Applying the monitor   Shave hair from upper left chest.   Hold abrader disc by orange tab.  Rub abrader in 40 strokes over left upper chest as indicated in your monitor instructions.   Clean area with 4 enclosed alcohol pads .  Use all pads to assure are is cleaned thoroughly.  Let dry.   Apply patch as indicated in monitor instructions.  Patch will be place under collarbone on left side of chest with arrow pointing upward.   Rub patch adhesive wings for 2 minutes.Remove white label marked "1".  Remove white label marked "2".  Rub patch adhesive wings for 2 additional minutes.   While looking in a mirror, press and release button in center of patch.  A small green light will flash 3-4 times .  This will be your only indicator the monitor has been turned on.     Do not shower for the first 24 hours.  You may shower after the first 24 hours.   Press button if you feel a symptom. You will hear a small click.  Record Date, Time and Symptom in the Patient Log Book.   When you are ready to remove patch, follow instructions on last 2 pages of Patient Log Book.  Stick patch monitor onto last page of Patient Log Book.   Place Patient Log Book in [place] box.  Use locking tab on box and tape box closed securely.  The [employer] has [employer] on it.  Please place in mailbox as soon as possible.  Your physician should have your test  results approximately 7 days after the monitor has been mailed back to [facility].   Call [place] at [phone] if you have questions regarding your ZIO XT patch monitor.  Call them immediately if you see an orange light blinking on your monitor.   If your monitor falls off in less than 4 days contact our Monitor department at [phone].  If your monitor becomes loose or falls off after 4 days call [employer] at [phone] for suggestions on securing your monitor.    Atrial Fibrillation  Atrial fibrillation is a type of heartbeat that is irregular or fast. If you have this condition, your heart beats without any order. This makes it hard for your heart to pump blood in a normal way. Atrial fibrillation may come and go, or it may become a long-lasting problem. If this condition is not treated, it can put you at higher risk  for stroke, heart failure, and other heart problems. What are the causes? This condition may be caused by diseases that damage the heart. They include:  High blood pressure.  Heart failure.  Heart valve disease.  Heart surgery. Other causes include:  Diabetes.  Thyroid disease.  Being overweight.  Kidney disease. Sometimes the cause is not known. What increases the risk? You are more likely to develop this condition if:  You are older.  You smoke.  You exercise often and very hard.  You have a family history of this condition.  You are a man.  You use drugs.  You drink a lot of alcohol.  You have lung conditions, such as emphysema, pneumonia, or COPD.  You have sleep apnea. What are the signs or symptoms? Common symptoms of this condition include:  A feeling that your heart is beating very fast.  Chest pain or discomfort.  Feeling short of breath.  Suddenly feeling light-headed or weak.  Getting tired easily during activity.  Fainting.  Sweating. In some cases, there are no symptoms. How is this  treated? Treatment for this condition depends on underlying conditions and how you feel when you have atrial fibrillation. They include:  Medicines to: ? Prevent blood clots. ? Treat heart rate or heart rhythm problems.  Using devices, such as a pacemaker, to correct heart rhythm problems.  Doing surgery to remove the part of the heart that sends bad signals.  Closing an area where clots can form in the heart (left atrial appendage). In some cases, your doctor will treat other underlying conditions. Follow these instructions at home: Medicines  Take over-the-counter and prescription medicines only as told by your doctor.  Do not take any new medicines without first talking to your doctor.  If you are taking blood thinners: ? Talk with your doctor before you take any medicines that have aspirin or NSAIDs, such as ibuprofen, in them. ? Take your medicine exactly as told by your doctor. Take it at the same time each day. ? Avoid activities that could hurt or bruise you. Follow instructions about how to prevent falls. ? Wear a bracelet that says you are taking blood thinners. Or, carry a card that lists what medicines you take. Lifestyle      Do not use any products that have nicotine or tobacco in them. These include cigarettes, e-cigarettes, and chewing tobacco. If you need help quitting, ask your doctor.  Eat heart-healthy foods. Talk with your doctor about the right eating plan for you.  Exercise regularly as told by your doctor.  Do not drink alcohol.  Lose weight if you are overweight.  Do not use drugs, including cannabis. General instructions  If you have a condition that causes breathing to stop for a short period of time (apnea), treat it as told by your doctor.  Keep a healthy weight. Do not use diet pills unless your doctor says they are safe for you. Diet pills may make heart problems worse.  Keep all follow-up visits as told by your doctor. This is  important. Contact a doctor if:  You notice a change in the speed, rhythm, or strength of your heartbeat.  You are taking a blood-thinning medicine and you get more bruising.  You get tired more easily when you move or exercise.  You have a sudden change in weight. Get help right away if:   You have pain in your chest or your belly (abdomen).  You have trouble breathing.  You have  side effects of blood thinners, such as blood in your vomit, poop (stool), or pee (urine), or bleeding that cannot stop.  You have any signs of a stroke. "BE FAST" is an easy way to remember the main warning signs: ? B - Balance. Signs are dizziness, sudden trouble walking, or loss of balance. ? E - Eyes. Signs are trouble seeing or a change in how you see. ? F - Face. Signs are sudden weakness or loss of feeling in the face, or the face or eyelid drooping on one side. ? A - Arms. Signs are weakness or loss of feeling in an arm. This happens suddenly and usually on one side of the body. ? S - Speech. Signs are sudden trouble speaking, slurred speech, or trouble understanding what people say. ? T - Time. Time to call emergency services. Write down what time symptoms started.  You have other signs of a stroke, such as: ? A sudden, very bad headache with no known cause. ? Feeling like you may vomit (nausea). ? Vomiting. ? A seizure. These symptoms may be an emergency. Do not wait to see if the symptoms will go away. Get medical help right away. Call your local emergency services (911 in the [place] Do not drive yourself to the hospital. Summary  Atrial fibrillation is a type of heartbeat that is irregular or fast.  You are at higher risk of this condition if you smoke, are older, have diabetes, or are overweight.  Follow your doctor's instructions about medicines, diet, exercise, and follow-up visits.  Get help right away if you have signs or symptoms of a stroke.  Get help right away if you cannot  catch your breath, or you have chest pain or discomfort. This information is not intended to replace advice given to you by your health care provider. Make sure you discuss any questions you have with your health care provider. Document Revised: 08/19/2018 Document Reviewed: 08/19/2018 Elsevier Patient Education  2020 [employer].

## 2019-08-21 NOTE — Addendum Note (Signed)
Addended by: Andrez Grime on: 08/21/2019 06:41 PM   Modules accepted: Orders

## 2019-08-23 ENCOUNTER — Other Ambulatory Visit: Payer: Self-pay | Admitting: *Deleted

## 2019-08-23 ENCOUNTER — Encounter: Payer: Self-pay | Admitting: *Deleted

## 2019-08-23 ENCOUNTER — Telehealth: Payer: Self-pay | Admitting: *Deleted

## 2019-08-23 DIAGNOSIS — I493 Ventricular premature depolarization: Secondary | ICD-10-CM

## 2019-08-23 DIAGNOSIS — I4891 Unspecified atrial fibrillation: Secondary | ICD-10-CM

## 2019-08-23 DIAGNOSIS — R002 Palpitations: Secondary | ICD-10-CM

## 2019-08-23 NOTE — Telephone Encounter (Signed)
-----   Message from Parke Poisson, MD sent at 08/20/2019  7:07 PM EDT ----- TSH checked in setting of new onset atrial fibrillation. TSH very low suggesting hyperthyroidism, would defer management to Dr. Doreene Burke, however this could certainly be driving atrial fibrillation.

## 2019-08-23 NOTE — Progress Notes (Signed)
Patient ID: Nathan Boyle, male   DOB: 11/17/1965, 54 y.o.   MRN: [id number] Patient enrolled for [employer] to ship a 14 day ZIO XT long term holter monitor to his home.

## 2019-08-23 NOTE — Telephone Encounter (Signed)
Called home number - left message to call back in regards to labs , called  mobile number unable to leave voicemail .

## 2019-08-24 NOTE — Telephone Encounter (Signed)
Patient has an appointment on 08/26/19 at primary for labs.

## 2019-08-26 ENCOUNTER — Other Ambulatory Visit: Payer: Self-pay

## 2019-08-26 ENCOUNTER — Other Ambulatory Visit (INDEPENDENT_AMBULATORY_CARE_PROVIDER_SITE_OTHER): Payer: 59

## 2019-08-26 DIAGNOSIS — R748 Abnormal levels of other serum enzymes: Secondary | ICD-10-CM

## 2019-08-26 DIAGNOSIS — R8290 Unspecified abnormal findings in urine: Secondary | ICD-10-CM | POA: Diagnosis not present

## 2019-08-26 LAB — URINALYSIS, ROUTINE W REFLEX MICROSCOPIC
Bilirubin Urine: NEGATIVE
Hgb urine dipstick: NEGATIVE
Ketones, ur: NEGATIVE
Nitrite: NEGATIVE
RBC / HPF: NONE SEEN (ref 0–?)
Specific Gravity, Urine: 1.025 (ref 1.000–1.030)
Total Protein, Urine: NEGATIVE
Urine Glucose: NEGATIVE
Urobilinogen, UA: 4 — AB (ref 0.0–1.0)
pH: 5.5 (ref 5.0–8.0)

## 2019-08-27 ENCOUNTER — Telehealth: Payer: Self-pay | Admitting: *Deleted

## 2019-08-27 NOTE — Telephone Encounter (Signed)
-----   Message from Reesa Chew, CMA sent at 08/27/2019  5:05 PM EDT ----- Regarding: FW: Neds split sleep study  ----- Message ----- From: Tobin Chad, RN Sent: 08/20/2019  11:37 AM EDT To: Tobin Chad, RN, Gaynelle Cage, CMA, # Subject: Neds split sleep study                         Pt seen by Dr. Jacques Navy today. Dx Snoring Epworth Scale Done. Ordered split night sleep study.

## 2019-08-29 ENCOUNTER — Ambulatory Visit (INDEPENDENT_AMBULATORY_CARE_PROVIDER_SITE_OTHER): Payer: 59

## 2019-08-29 DIAGNOSIS — R002 Palpitations: Secondary | ICD-10-CM

## 2019-08-29 DIAGNOSIS — I493 Ventricular premature depolarization: Secondary | ICD-10-CM

## 2019-08-29 DIAGNOSIS — I4891 Unspecified atrial fibrillation: Secondary | ICD-10-CM

## 2019-08-31 ENCOUNTER — Telehealth: Payer: Self-pay | Admitting: *Deleted

## 2019-08-31 NOTE — Telephone Encounter (Signed)
PA request faxed to [facility] for in lab sleep study.

## 2019-09-01 LAB — URINE CULTURE
MICRO NUMBER:: 10603347
Result:: NO GROWTH
SPECIMEN QUALITY:: ADEQUATE

## 2019-09-01 LAB — ALKALINE PHOSPHATASE ISOENZYMES
Alkaline phosphatase (APISO): 143 U/L (ref 35–144)
Bone Isoenzymes: 75 % — ABNORMAL HIGH (ref 28–66)
Intestinal Isoenzymes: 0 % — ABNORMAL LOW (ref 1–24)
Liver Isoenzymes: 25 % (ref 25–69)

## 2019-09-01 LAB — HIV ANTIBODY (ROUTINE TESTING W REFLEX): HIV 1&2 Ab, 4th Generation: NONREACTIVE

## 2019-09-08 ENCOUNTER — Encounter: Payer: Self-pay | Admitting: Family Medicine

## 2019-09-08 ENCOUNTER — Other Ambulatory Visit: Payer: Self-pay

## 2019-09-08 MED ORDER — CLOBETASOL PROPIONATE 0.05 % EX CREA: 1.0000 "application " | TOPICAL_CREAM | Freq: Every day | CUTANEOUS | 0 refills | Status: AC | PRN

## 2019-09-17 ENCOUNTER — Other Ambulatory Visit: Payer: Self-pay

## 2019-09-17 ENCOUNTER — Ambulatory Visit (HOSPITAL_COMMUNITY): Payer: 59 | Attending: Internal Medicine

## 2019-09-17 DIAGNOSIS — Z8616 Personal history of COVID-19: Secondary | ICD-10-CM | POA: Diagnosis present

## 2019-09-17 DIAGNOSIS — I4891 Unspecified atrial fibrillation: Secondary | ICD-10-CM | POA: Diagnosis present

## 2019-09-22 ENCOUNTER — Telehealth: Payer: Self-pay | Admitting: *Deleted

## 2019-09-22 NOTE — Telephone Encounter (Signed)
Left message for patient to call back . neeed to give result and set another test .  result released into mychart

## 2019-09-22 NOTE — Telephone Encounter (Signed)
-----   Message from Parke Poisson, MD sent at 09/22/2019  6:35 AM EDT ----- Pumping function looks normal, and atria are normal size in setting of afib. There is a possible atrial level shunt, please arrange for a limited echo bubble study to assess further for PFO.

## 2019-09-27 ENCOUNTER — Telehealth: Payer: Self-pay | Admitting: Internal Medicine

## 2019-09-27 NOTE — Telephone Encounter (Signed)
Tresa Endo with [employer] is calling to report critical zio monitor results.

## 2019-09-27 NOTE — Telephone Encounter (Signed)
Spoke to  [employer]. Tresa Endo - she states monitor report has been posted critical . Patient had a 60 sec.  Episode of afib with/ RVR-- Rate of 218 on June 26 , 2021.  per representative-  Per report his underlining rhythm was Afib  100 %.  Patient wore  monitor from June 25- July 4.   Patient has follow up appointment on Aug 27 with Dr Jacques Navy.    Will defer to Dr Jacques Navy for further instructions

## 2019-09-28 NOTE — Telephone Encounter (Signed)
Left message to call back  

## 2019-10-18 ENCOUNTER — Other Ambulatory Visit: Payer: Self-pay | Admitting: *Deleted

## 2019-10-18 ENCOUNTER — Telehealth: Payer: Self-pay | Admitting: *Deleted

## 2019-10-18 DIAGNOSIS — R0683 Snoring: Secondary | ICD-10-CM

## 2019-10-18 DIAGNOSIS — I480 Paroxysmal atrial fibrillation: Secondary | ICD-10-CM

## 2019-10-18 NOTE — Telephone Encounter (Signed)
Left HSS appointment details on cell VM.

## 2019-11-05 ENCOUNTER — Other Ambulatory Visit: Payer: Self-pay

## 2019-11-05 ENCOUNTER — Encounter: Payer: Self-pay | Admitting: Internal Medicine

## 2019-11-05 ENCOUNTER — Ambulatory Visit (INDEPENDENT_AMBULATORY_CARE_PROVIDER_SITE_OTHER): Payer: 59 | Admitting: Internal Medicine

## 2019-11-05 VITALS — BP 130/82 | HR 82 | Temp 96.7°F | Ht 72.0 in | Wt 230.0 lb

## 2019-11-05 DIAGNOSIS — I4819 Other persistent atrial fibrillation: Secondary | ICD-10-CM

## 2019-11-05 DIAGNOSIS — I493 Ventricular premature depolarization: Secondary | ICD-10-CM | POA: Diagnosis not present

## 2019-11-05 DIAGNOSIS — R002 Palpitations: Secondary | ICD-10-CM

## 2019-11-05 DIAGNOSIS — R0683 Snoring: Secondary | ICD-10-CM | POA: Diagnosis not present

## 2019-11-05 DIAGNOSIS — Q211 Atrial septal defect: Secondary | ICD-10-CM

## 2019-11-05 DIAGNOSIS — E785 Hyperlipidemia, unspecified: Secondary | ICD-10-CM

## 2019-11-05 DIAGNOSIS — Q2112 Patent foramen ovale: Secondary | ICD-10-CM

## 2019-11-05 MED ORDER — METOPROLOL TARTRATE 50 MG PO TABS: 50.0000 mg | ORAL_TABLET | Freq: Two times a day (BID) | ORAL | 3 refills | Status: DC

## 2019-11-05 NOTE — Progress Notes (Signed)
Cardiology Office Note:    Date:  11/05/2019   ID:  Nathan Boyle, DOB October 23, 1965, MRN [id number]  PCP:  Nathan Sax, MD  Cardiologist:  Nathan Poisson, MD  Electrophysiologist:  None   Referring MD: Nathan Boyle,*   Chief Complaint: persistent atrial fibrillation, hyperthyroidism  History of Present Illness:    Nathan Boyle is a 54 y.o. male with a history of HLD, seizures at age 84, and ventricular ectopy, incidentally noted to be in afib at our initial consultation, noted to have hyperthyroidism now being addressed by his PCP Dr. Doreene Boyle.   Feeling great. No cp, sob, occasional stress-related palpitations.   100% burden of afib with rapid ventricular response. Echo with normal EF and normal size atrial. Probable PFO. We discussed repeat limited echo with agitated saline to assess further.    Past Medical History:  Diagnosis Date  . Allergy   . Blood transfusion without reported diagnosis    thinks had transfusion with foot surgery 20 + years ago  . Eczema   . Hyperlipidemia    on medicines for this  . Seizures (HCC)    at age 66  . Ulcer    30 yrs ago- age 10-20 per pt.    Past Surgical History:  Procedure Laterality Date  . CARPAL TUNNEL RELEASE     carpal tunnel release right   . FOOT SURGERY     x2    Current Medications: Current Meds  Medication Sig  . clobetasol cream (TEMOVATE) 0.05 % Apply 1 application topically daily as needed.  . fexofenadine-pseudoephedrine (ALLEGRA-D 12 HOUR) 60-120 MG per tablet Take 1 tablet by mouth 2 (two) times daily. (Patient taking differently: Take 1 tablet by mouth daily. )  . loratadine (CLARITIN) 10 MG tablet Take 10 mg by mouth daily.  . metoprolol tartrate (LOPRESSOR) 50 MG tablet Take 1 tablet (50 mg total) by mouth 2 (two) times daily.  . Multiple Vitamins-Minerals (MENS ONE DAILY PO) Take by mouth.  . simvastatin (ZOCOR) 20 MG tablet Take 1 tablet (20 mg total) by mouth at bedtime.  .  [DISCONTINUED] metoprolol tartrate (LOPRESSOR) 25 MG tablet Take 1 tablet (25 mg total) by mouth 2 (two) times daily.     Allergies:   Penicillins   Social History   Socioeconomic History  . Marital status: Married    Spouse name: Not on file  . Number of children: Not on file  . Years of education: Not on file  . Highest education level: Not on file  Occupational History  . Not on file  Tobacco Use  . Smoking status: Never Smoker  . Smokeless tobacco: Never Used  Substance and Sexual Activity  . Alcohol use: No    Alcohol/week: 0.0 standard drinks  . Drug use: No  . Sexual activity: Not on file  Other Topics Concern  . Not on file  Social History Narrative   Updated 02/06/15   Work: Actor      Lives with: wife and 2 children      Active at work, diet is good   Chemical engineer Strain:   . Difficulty of Paying Living Expenses: Not on file  Food Insecurity:   . Worried About Programme researcher, broadcasting/film/video in the Last Year: Not on file  . Ran Out of Food in the Last Year: Not on file  Transportation Needs:   . Lack of Transportation (Medical): Not on  file  . Lack of Transportation (Non-Medical): Not on file  Physical Activity:   . Days of Exercise per Week: Not on file  . Minutes of Exercise per Session: Not on file  Stress:   . Feeling of Stress : Not on file  Social Connections:   . Frequency of Communication with Friends and Family: Not on file  . Frequency of Social Gatherings with Friends and Family: Not on file  . Attends Religious Services: Not on file  . Active Member of Clubs or Organizations: Not on file  . Attends Banker Meetings: Not on file  . Marital Status: Not on file     Family History: The patient's family history includes Arthritis in his sister; Heart murmur in his mother; Hyperlipidemia in his mother; Hypertension in his mother; Rheum arthritis in his sister; Sickle cell trait in  his sister; Stroke in his maternal grandmother and mother. There is no history of Colon cancer, Rectal cancer, or Stomach cancer.  ROS:   Please see the history of present illness.    All other systems reviewed and are negative.  EKGs/Labs/Other Studies Reviewed:    The following studies were reviewed today:  EKG:  afib rate 82  Recent Labs: 08/06/2019: ALT 19; BUN 13; Creatinine, Ser 0.77; Hemoglobin 13.5; Platelets 154.0; Potassium 4.1; Sodium 139 08/20/2019: TSH <0.005  Recent Lipid Panel    Component Value Date/Time   CHOL 156 08/06/2019 0846   TRIG 61.0 08/06/2019 0846   HDL 49.10 08/06/2019 0846   CHOLHDL 3 08/06/2019 0846   VLDL 12.2 08/06/2019 0846   LDLCALC 95 08/06/2019 0846   LDLDIRECT 96.0 08/06/2019 0846    Physical Exam:    VS:  BP 130/82   Pulse 82   Temp (!) 96.7 F (35.9 C)   Ht 6' (1.829 m)   Wt 230 lb (104.3 kg)   SpO2 97%   BMI 31.19 kg/m     Wt Readings from Last 5 Encounters:  11/05/19 230 lb (104.3 kg)  08/20/19 220 lb 12.8 oz (100.2 kg)  08/06/19 217 lb 3.2 oz (98.5 kg)  12/17/17 239 lb (108.4 kg)  06/06/16 239 lb (108.4 kg)     Constitutional: No acute distress Eyes: sclera non-icteric, normal conjunctiva and lids ENMT: normal dentition, moist mucous membranes Cardiovascular: irregular rhythm, normal rate, no murmurs. S1 and S2 normal. Radial pulses normal bilaterally. No jugular venous distention.  Respiratory: clear to auscultation bilaterally GI : normal bowel sounds, soft and nontender. No distention.   MSK: extremities warm, well perfused. No edema.  NEURO: grossly nonfocal exam, moves all extremities. PSYCH: alert and oriented x 3, normal mood and affect.   ASSESSMENT:    1. Persistent atrial fibrillation (HCC)   2. PFO (patent foramen ovale)   3. Snoring   4. PVC (premature ventricular contraction)   5. Palpitations   6. Hyperlipidemia, unspecified hyperlipidemia type    PLAN:    Persistent atrial fibrillation (HCC) -  Plan: EKG 12-Lead, metoprolol tartrate (LOPRESSOR) 50 MG tablet BID He remains in atrial fibrillation and at times has rapid ventricular response based on cardiac monitor performed recently. For better rate control we will increase his Lopressor to 50 mg twice daily. Currently he is CHA2DS2-VASc 0, I reviewed stroke risks, we participated in shared decision-making and deferred initiation of anticoagulation. He has hyperthyroidism which requires further work-up, and until this is better controlled, it is unlikely that cardioversion would be advisable. He is also undergoing a home sleep study  soon for further evaluation of reversible causes of atrial fibrillation. Once his thyroid has been fully evaluated and treated, if he remains in atrial fibrillation at that time I would consider anticoagulation and cardioversion or attempted sinus rhythm. No signs of tachycardia mediated cardiomyopathy at this time, EF is normal. He may need evaluation with endocrinology to fully treat his thyroid condition.  PFO (patent foramen ovale) - Plan: ECHOCARDIOGRAM LIMITED BUBBLE STUDY -Was noted to have a PFO on echocardiogram, with recommendation for limited bubble study. We will perform this.  Snoring-undergoing home sleep study  PVC (premature ventricular contraction)- Palpitations -We will increase dose of metoprolol  Hyperlipidemia, unspecified hyperlipidemia type-continue simvastatin 20 mg daily, lipids are currently well controlled.  Total time of encounter: 30 minutes total time of encounter, including 25 minutes spent in face-to-face patient care on the date of this encounter. This time includes coordination of care and counseling regarding above mentioned problem list. Remainder of non-face-to-face time involved reviewing chart documents/testing relevant to the patient encounter and documentation in the medical record. I have independently reviewed documentation from referring provider.   Weston Brass,  MD [place]  [facility]    Medication Adjustments/Labs and Tests Ordered: Current medicines are reviewed at length with the patient today.  Concerns regarding medicines are outlined above.  Orders Placed This Encounter  Procedures  . EKG 12-Lead  . ECHOCARDIOGRAM LIMITED BUBBLE STUDY   Meds ordered this encounter  Medications  . metoprolol tartrate (LOPRESSOR) 50 MG tablet    Sig: Take 1 tablet (50 mg total) by mouth 2 (two) times daily.    Dispense:  180 tablet    Refill:  3    Patient Instructions  Medication Instructions:  Increase Metoprolol to 50 mg twice a day  *If you need a refill on your cardiac medications before your next appointment, please call your pharmacy*   Lab Work: None   Testing/Procedures: Your physician has requested that you have an echocardiogram. Echocardiography is a painless test that uses sound waves to create images of your heart. It provides your doctor with information about the size and shape of your heart and how well your heart's chambers and valves are working. This procedure takes approximately one hour. There are no restrictions for this procedure. [address]    Follow-Up: At [facility], you and your health needs are our priority.  As part of our continuing mission to provide you with exceptional heart care, we have created designated Provider Care Teams.  These Care Teams include your primary Cardiologist (physician) and Advanced Practice Providers (APPs -  Physician Assistants and Nurse Practitioners) who all work together to provide you with the care you need, when you need it.  We recommend signing up for the patient portal called "MyChart".  Sign up information is provided on this After Visit Summary.  MyChart is used to connect with patients for Virtual Visits (Telemedicine).  Patients are able to view lab/test results, encounter notes, upcoming appointments, etc.  Non-urgent messages can be sent to  your provider as well.   To learn more about what you can do with MyChart, go to [email or web address].    Your next appointment:   3 month(s)  The format for your next appointment:   In Person  Provider:   Weston Brass, MD

## 2019-11-05 NOTE — Patient Instructions (Signed)
Medication Instructions:  Increase Metoprolol to 50 mg twice a day  *If you need a refill on your cardiac medications before your next appointment, please call your pharmacy*   Lab Work: None   Testing/Procedures: Your physician has requested that you have an echocardiogram. Echocardiography is a painless test that uses sound waves to create images of your heart. It provides your doctor with information about the size and shape of your heart and how well your heart's chambers and valves are working. This procedure takes approximately one hour. There are no restrictions for this procedure. [address]    Follow-Up: At [facility], you and your health needs are our priority.  As part of our continuing mission to provide you with exceptional heart care, we have created designated Provider Care Teams.  These Care Teams include your primary Cardiologist (physician) and Advanced Practice Providers (APPs -  Physician Assistants and Nurse Practitioners) who all work together to provide you with the care you need, when you need it.  We recommend signing up for the patient portal called "MyChart".  Sign up information is provided on this After Visit Summary.  MyChart is used to connect with patients for Virtual Visits (Telemedicine).  Patients are able to view lab/test results, encounter notes, upcoming appointments, etc.  Non-urgent messages can be sent to your provider as well.   To learn more about what you can do with MyChart, go to [email or web address].    Your next appointment:   3 month(s)  The format for your next appointment:   In Person  Provider:   Weston Brass, MD

## 2019-12-10 ENCOUNTER — Encounter (HOSPITAL_BASED_OUTPATIENT_CLINIC_OR_DEPARTMENT_OTHER): Payer: 59 | Admitting: Cardiovascular Disease

## 2020-02-08 ENCOUNTER — Telehealth (HOSPITAL_COMMUNITY): Payer: Self-pay | Admitting: Internal Medicine

## 2020-02-08 NOTE — Telephone Encounter (Signed)
Patient spouse cancelled appointment for echo/Bubble and will call back at a later date to reschedule. Order will be removed from the WQ and when patient calls back to reschedule we will reinstate the order.

## 2020-02-11 ENCOUNTER — Other Ambulatory Visit (HOSPITAL_COMMUNITY): Payer: 59

## 2020-03-24 ENCOUNTER — Ambulatory Visit: Payer: 59 | Admitting: Internal Medicine

## 2020-06-21 ENCOUNTER — Encounter: Payer: Self-pay | Admitting: Family Medicine

## 2020-07-24 ENCOUNTER — Other Ambulatory Visit: Payer: Self-pay | Admitting: Family Medicine

## 2020-07-24 DIAGNOSIS — E7800 Pure hypercholesterolemia, unspecified: Secondary | ICD-10-CM

## 2020-10-25 NOTE — Telephone Encounter (Signed)
LVM for patient to schedule appointment.

## 2020-10-27 ENCOUNTER — Other Ambulatory Visit: Payer: Self-pay | Admitting: Family Medicine

## 2020-10-27 ENCOUNTER — Other Ambulatory Visit: Payer: Self-pay | Admitting: Internal Medicine

## 2020-10-27 DIAGNOSIS — E7800 Pure hypercholesterolemia, unspecified: Secondary | ICD-10-CM

## 2020-10-27 DIAGNOSIS — I4819 Other persistent atrial fibrillation: Secondary | ICD-10-CM

## 2021-02-25 ENCOUNTER — Other Ambulatory Visit: Payer: Self-pay | Admitting: Family Medicine

## 2021-02-25 DIAGNOSIS — E7800 Pure hypercholesterolemia, unspecified: Secondary | ICD-10-CM

## 2021-03-28 ENCOUNTER — Telehealth: Payer: Self-pay | Admitting: Family Medicine

## 2021-03-28 NOTE — Telephone Encounter (Signed)
Pt requesting TOC to Dr Veto Kemps from Dr Doreene Burke. It looks like he has only seen Dr Doreene Burke twice, last seen in 2021.  Wife see's Dr Veto Kemps.

## 2021-04-28 ENCOUNTER — Emergency Department (HOSPITAL_BASED_OUTPATIENT_CLINIC_OR_DEPARTMENT_OTHER)
Admission: EM | Admit: 2021-04-28 | Discharge: 2021-04-28 | Disposition: A | Discharge status: Home / Self Care | Payer: 59 | Attending: Emergency Medicine | Admitting: Emergency Medicine

## 2021-04-28 ENCOUNTER — Encounter (HOSPITAL_BASED_OUTPATIENT_CLINIC_OR_DEPARTMENT_OTHER): Payer: Self-pay | Admitting: Emergency Medicine

## 2021-04-28 ENCOUNTER — Other Ambulatory Visit: Payer: Self-pay

## 2021-04-28 ENCOUNTER — Emergency Department (HOSPITAL_BASED_OUTPATIENT_CLINIC_OR_DEPARTMENT_OTHER): Payer: 59

## 2021-04-28 DIAGNOSIS — S62525A Nondisplaced fracture of distal phalanx of left thumb, initial encounter for closed fracture: Secondary | ICD-10-CM | POA: Diagnosis not present

## 2021-04-28 DIAGNOSIS — S62524S Nondisplaced fracture of distal phalanx of right thumb, sequela: Secondary | ICD-10-CM | POA: Diagnosis not present

## 2021-04-28 DIAGNOSIS — S6992XA Unspecified injury of left wrist, hand and finger(s), initial encounter: Secondary | ICD-10-CM | POA: Diagnosis not present

## 2021-04-28 DIAGNOSIS — S6991XS Unspecified injury of right wrist, hand and finger(s), sequela: Secondary | ICD-10-CM | POA: Diagnosis not present

## 2021-04-28 DIAGNOSIS — S62521A Displaced fracture of distal phalanx of right thumb, initial encounter for closed fracture: Secondary | ICD-10-CM | POA: Diagnosis not present

## 2021-04-28 DIAGNOSIS — W231XXS Caught, crushed, jammed, or pinched between stationary objects, sequela: Secondary | ICD-10-CM | POA: Diagnosis not present

## 2021-04-28 DIAGNOSIS — T148XXA Other injury of unspecified body region, initial encounter: Secondary | ICD-10-CM

## 2021-04-28 MED ORDER — LIDOCAINE HCL 2 % IJ SOLN
10.0000 mL | Freq: Once | INTRAMUSCULAR | Status: AC
  Administered 2021-04-28: 200 mg
  Filled 2021-04-28: qty 20

## 2021-04-28 NOTE — ED Triage Notes (Signed)
Pt reports he jammed his L thumb a month ago. Pt has area of swelling below nail. Pt reports last time this happened he needed to have the area drained.

## 2021-04-28 NOTE — ED Notes (Signed)
PA made aware of pt's BP.  Education and resource information provided regarding HTN.  PA instructed pt. To follow up with PCP and to check and record BP at home to show trends to PCP.

## 2021-04-28 NOTE — Discharge Instructions (Addendum)
Your x-ray showed that you have a well-healing fracture on the knuckle of your thumb.  The area above it that you were concerned was an abscess is more likely hematoma based on presentation and after I&D.  This is just a collection of blood underneath the skin likely from when you fractured your thumb.  Clean the area once a day with soap and water.  Change the dressing once or twice daily as well.  You on the look out for signs infection including redness, swelling, drainage, warmth to the touch.  Follow-up with your primary care doctor as needed.

## 2021-04-29 NOTE — ED Provider Notes (Signed)
[facility] EMERGENCY DEPARTMENT Provider Note   CSN: [id number] Arrival date & time: 04/28/21  1557     History  Chief Complaint  Patient presents with   Finger Injury    Nathan Boyle is a 56 y.o. male who presents accompanied by his wife for suspected abscess above the DIP of the right left thumb. Pt states that he jammed the thumb pretty severely and noticed the lump started shortly after. He attempted to squeeze what he thought was pus out, but was unsuccessful. He comes to ED today for drainage. Pt denies fevers, chills, numbness and tingling.  HPI     Home Medications Prior to Admission medications   Medication Sig Start Date End Date Taking? Authorizing Provider  clobetasol cream (TEMOVATE) 0.05 % Apply 1 application topically daily as needed. 09/08/19   Mliss Sax, MD  fexofenadine-pseudoephedrine (ALLEGRA-D 12 HOUR) 60-120 MG per tablet Take 1 tablet by mouth 2 (two) times daily. Patient taking differently: Take 1 tablet by mouth daily.  07/05/13   Eulis Foster, FNP  loratadine (CLARITIN) 10 MG tablet Take 10 mg by mouth daily.    [provider]  metoprolol tartrate (LOPRESSOR) 50 MG tablet Take 1 tablet (50 mg total) by mouth 2 (two) times daily. 11/05/19 02/03/20  Parke Poisson, MD  Multiple Vitamins-Minerals (MENS ONE DAILY PO) Take by mouth.    [provider]  simvastatin (ZOCOR) 20 MG tablet TAKE 1 TABLET(20 MG) BY MOUTH AT BEDTIME 10/27/20   Mliss Sax, MD      Allergies    Penicillins    Review of Systems   Review of Systems  Skin:  Positive for wound.   Physical Exam Updated Vital Signs BP (!) 170/105 (BP Location: Left Arm)    Pulse 71    Temp 97.8 F (36.6 C) (Oral)    Resp 16    SpO2 99%  Physical Exam Vitals and nursing note reviewed.  Constitutional:      General: He is not in acute distress.    Appearance: He is not ill-appearing.  HENT:     Head: Atraumatic.  Eyes:      Conjunctiva/sclera: Conjunctivae normal.  Cardiovascular:     Rate and Rhythm: Normal rate and regular rhythm.     Pulses: Normal pulses.     Heart sounds: No murmur heard. Pulmonary:     Effort: Pulmonary effort is normal. No respiratory distress.     Breath sounds: Normal breath sounds.  Abdominal:     General: Abdomen is flat. There is no distension.     Palpations: Abdomen is soft.     Tenderness: There is no abdominal tenderness.  Musculoskeletal:        General: Normal range of motion.     Cervical back: Normal range of motion.     Comments: Good thumb to pinky opposition. Full ROM of bilateral thumbs. Sensation intact  Skin:    General: Skin is warm and dry.     Capillary Refill: Capillary refill takes less than 2 seconds.     Comments: 1-1.5 cm area of firm fluctuance without erythema, tenderness on the dorsal DIP of left thumb  Neurological:     General: No focal deficit present.     Mental Status: He is alert.  Psychiatric:        Mood and Affect: Mood normal.    ED Results / Procedures / Treatments   Labs (all labs ordered are listed, but only  abnormal results are displayed) Labs Reviewed - No data to display  EKG None  Radiology DG Finger Thumb Left  Result Date: 04/28/2021 CLINICAL DATA:  Left thumb injury 1 month ago EXAM: LEFT THUMB 2+V COMPARISON:  None. FINDINGS: Possible nondisplaced fracture at the base of the distal phalanx along the ulnar aspect, with subacute appearance. IMPRESSION: Possible nondisplaced fracture at the base of the thumb distal phalanx, with a subacute appearance. Electronically Signed   By: Caprice Renshaw M.D.   On: 04/28/2021 16:46    Procedures .Marland KitchenIncision and Drainage  Date/Time: 04/29/2021 9:39 AM Performed by: Janell Quiet, PA-C Authorized by: Janell Quiet, PA-C   Consent:    Consent obtained:  Verbal   Consent given by:  Patient   Risks discussed:  Bleeding, incomplete drainage, pain and infection   Alternatives  discussed:  No treatment and observation Universal protocol:    Procedure explained and questions answered to patient or proxy's satisfaction: yes     Relevant documents present and verified: yes     Test results available : yes     Imaging studies available: yes     Required blood products, implants, devices, and special equipment available: yes     Site/side marked: yes     Immediately prior to procedure, a time out was called: yes     Patient identity confirmed:  Verbally with patient Location:    Type:  Hematoma   Size:  1.5cm   Location:  Upper extremity   Upper extremity location:  Finger   Finger location:  L thumb Pre-procedure details:    Skin preparation:  Betadine Sedation:    Sedation type:  None Anesthesia:    Anesthesia method:  Nerve block   Block location:  Left thumb   Block needle gauge:  25 G   Block anesthetic:  Lidocaine 2% w/o epi   Block technique:  Bilateral infiltration   Block injection procedure:  Introduced needle, negative aspiration for blood, incremental injection and anatomic landmarks palpated   Block outcome:  Anesthesia achieved Procedure type:    Complexity:  Simple Procedure details:    Ultrasound guidance: no     Needle aspiration: no     Incision types:  Single straight   Incision depth:  Subcutaneous   Wound management:  Irrigated with saline and extensive cleaning   Drainage:  Bloody   Drainage amount:  Moderate   Wound treatment:  Wound left open   Packing materials:  None Post-procedure details:    Procedure completion:  Tolerated well, no immediate complications    Medications Ordered in ED Medications  lidocaine (XYLOCAINE) 2 % (with pres) injection 200 mg (200 mg Other Given by Other 04/28/21 1754)    ED Course/ Medical Decision Making/ A&P                           Medical Decision Making Risk Prescription drug management.   History:  Per HPI  Initial impression:  This patient presents to the ED for concern of  thumb injury, concern for abscess, this involves an extensive number of treatment options, and is a complaint that carries with it a high risk of complications and morbidity.     ED Course: Pleasant 56 year old male in no acute distress, nontoxic-appearing.  Small area of firm fluctuance over the DIP of the left thumb. Suspicion is higher for hematoma vs. Abscess given clinical presentation. Left hand xray identified well healing fracture  of the DIP joint of left thumb, patient denies current pain. Discussed that this far out from injury, I&D is less likely to have drainage, especially as this does not seem to be an abscess. Pt understands but wishes to attempt drainage regardless.   I independently visualized and interpreted imaging and I agree with the radiologist interpretation.     Disposition:  After consideration of the diagnostic results, physical exam, history and the patients response to treatment feel that the patent would benefit from discharge with strict return precautions.   Hematoma Closed fracture of distal phalanx of left thumb: Small to moderate amounts of blood and clots expressed from well-healing hematoma.  Discussed patient risk of infection or likelihood of hematoma filling back up with blood.  Advised on signs and symptoms of infection and when to return to the ED.  All questions asked and answered.  Wound care discussed.  Patient and wife expressed understanding and are discharged home in good condition.   Final Clinical Impression(s) / ED Diagnoses Final diagnoses:  Hematoma  Closed nondisplaced fracture of distal phalanx of right thumb, sequela    Rx / DC Orders ED Discharge Orders     None         Janell Quiet, PA-C 04/29/21 0946    Vanetta Mulders, MD 05/10/21 [phone]

## 2021-05-25 ENCOUNTER — Encounter: Payer: Self-pay | Admitting: Family Medicine

## 2021-05-25 ENCOUNTER — Ambulatory Visit (INDEPENDENT_AMBULATORY_CARE_PROVIDER_SITE_OTHER): Payer: 59 | Admitting: Family Medicine

## 2021-05-25 ENCOUNTER — Other Ambulatory Visit: Payer: Self-pay

## 2021-05-25 VITALS — BP 160/94 | HR 64 | Temp 97.3°F | Ht 72.0 in | Wt 238.8 lb

## 2021-05-25 DIAGNOSIS — J301 Allergic rhinitis due to pollen: Secondary | ICD-10-CM

## 2021-05-25 DIAGNOSIS — E782 Mixed hyperlipidemia: Secondary | ICD-10-CM | POA: Diagnosis not present

## 2021-05-25 DIAGNOSIS — I10 Essential (primary) hypertension: Secondary | ICD-10-CM | POA: Diagnosis not present

## 2021-05-25 DIAGNOSIS — I4819 Other persistent atrial fibrillation: Secondary | ICD-10-CM

## 2021-05-25 MED ORDER — SIMVASTATIN 20 MG PO TABS: 20.0000 mg | ORAL_TABLET | Freq: Every day | ORAL | 3 refills | Status: DC

## 2021-05-25 MED ORDER — AMLODIPINE BESYLATE 5 MG PO TABS: 5.0000 mg | ORAL_TABLET | Freq: Every day | ORAL | 3 refills | Status: DC

## 2021-05-25 NOTE — Progress Notes (Signed)
?[facility] ?[facility] ?[address] ?[place] [address] ?Dept: [phone] ?Dept Fax: [phone] ? ?Transfer of Care Office Visit ? ?Subjective:  ? ? Patient ID: Nathan Boyle, male    DOB: 10/17/1965, 56 y.o..   MRN: [id number] ? ?Chief Complaint  ?Patient presents with  ? Establish Care  ?  TOC- establish care.   No concerns.  Not fasting.  ? ? ?History of Present Illness: ? ?Patient is in today to establish care. Mr. Husby was born in [place]. He attended [employer] and [employer] with a background in Dentist. He works for [employer] currently. He has been married for 36 years. He has two children; Daniel (26) and Jessica (22). He denies any drug tobacco, or alcohol use. ? ?Mr. Shinault has a history of hyperlipidemia. He was managed on simvastatin, though he has been out of this for several weeks. ? 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 He had been managed on metoprolol. He notes he has been off of this for quite some time and denies any episodes of palpitations. ? ?Mr. Mcmahill has a history of allergic rhinitis, which is primarily seasonal. He has been using Allegra-D daily, as he finds this helps. ? ?Mr. Terlizzi was recently seen in the ED with an infected finger. This is improving. He notes the ER was concerned about him having an elevated BP (170/105). ? ?Past Medical History: ?Patient Active Problem List  ? Diagnosis Date Noted  ? Essential hypertension 05/25/2021  ? 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 Elevated alkaline phosphatase level 08/06/2019  ? Abnormal urine 08/06/2019  ? B12 deficiency 12/17/2017  ? Nocturia 12/17/2017  ? Allergic rhinitis 02/06/2015  ? Eczema 02/06/2015  ? Hyperlipidemia 12/29/2013  ? ?Past Surgical History:  ?Procedure Laterality Date  ? CARPAL TUNNEL RELEASE    ? carpal tunnel release right   ? FOOT SURGERY    ? x2  ? ?Family History  ?Problem Relation Age of Onset  ? Hyperlipidemia Mother    ? Stroke Mother   ? Heart murmur Mother   ? Hypertension Mother   ? Arthritis Sister   ? Rheum arthritis Sister   ? Sickle cell trait Sister   ? Stroke Maternal Aunt   ? Cancer Maternal Grandmother   ? Stroke Maternal Grandmother   ? Colon cancer Neg Hx   ? Rectal cancer Neg Hx   ? Stomach cancer Neg Hx   ? ?Outpatient Medications Prior to Visit  ?Medication Sig Dispense Refill  ? clobetasol cream (TEMOVATE) 0.05 % Apply 1 application topically daily as needed. 60 g 0  ? fexofenadine-pseudoephedrine (ALLEGRA-D 12 HOUR) 60-120 MG per tablet Take 1 tablet by mouth 2 (two) times daily. (Patient taking differently: Take 1 tablet by mouth daily.) 180 tablet 0  ? Multiple Vitamins-Minerals (MENS ONE DAILY PO) Take by mouth.    ? loratadine (CLARITIN) 10 MG tablet Take 10 mg by mouth daily.    ? metoprolol tartrate (LOPRESSOR) 50 MG tablet Take 1 tablet (50 mg total) by mouth 2 (two) times daily. 180 tablet 3  ? simvastatin (ZOCOR) 20 MG tablet TAKE 1 TABLET(20 MG) BY MOUTH AT BEDTIME 30 tablet 1  ? ?No facility-administered medications prior to visit.  ? ?Allergies  ?Allergen Reactions  ? Penicillins Hives  ?  Has patient had a PCN reaction causing immediate rash, facial/tongue/throat swelling, SOB or lightheadedness with hypotension: Yes ?Has patient had a PCN reaction causing severe rash involving mucus  membranes or skin necrosis: No ?Has patient had a PCN reaction that required hospitalization No ?Has patient had a PCN reaction occurring within the last 10 years: No ?If all of the above answers are "NO", then may proceed with Cephalosporin use.  ?  ?Objective:  ? ?Today's Vitals  ? 05/25/21 1358 05/25/21 1402  ?BP: (!) 150/86 (!) 160/94  ?Pulse: 64   ?Temp: (!) 97.3 ?F (36.3 ?C)   ?TempSrc: Temporal   ?SpO2: 98%   ?Weight: 238 lb 12.8 oz (108.3 kg)   ?Height: 6' (1.829 m)   ? ?Body mass index is 32.39 kg/m?.  ? ?General: Well developed, well nourished. No acute distress. ?Psych: Alert and oriented. Normal mood and  affect. ? ?Health Maintenance Due  ?Topic Date Due  ? Zoster Vaccines- Shingrix (1 of 2) Never done  ?   ?Assessment & Plan:  ? ?1. Essential hypertension ?Mr. Jenkinson has significantly high blood pressure.  I will start him on amlodipine and reassess him in 3 months. We did discuss the importance of avoiding stimulants, such as the psuedoephedrine which he has been using daily. ? ?- amLODipine (NORVASC) 5 MG tablet; Take 1 tablet (5 mg total) by mouth daily.  Dispense: 90 tablet; Refill: 3 ? ?2. Mixed hyperlipidemia ?I will have Mr. Nixon restart on his simvastatin. I will plan to reassess his lipids at his next visit. ? ?- simvastatin (ZOCOR) 20 MG tablet; Take 1 tablet (20 mg total) by mouth daily at 6 PM.  Dispense: 90 tablet; Refill: 3 ? ?3. Seasonal allergic rhinitis due to pollen ?Stable. I recommend he use Allegra rather than Allegra-D for his baseline allergy control, reserving Allegra-D for flares. ? ?After the visit, in reviewing cardiology consult notes, I see that Mr. Hillis has a history of atrial fibrillation. He was apparently prescribe metoprolol for rate control, not for ventricular ectopy. His CHA2DS2-VASc score was 0, so he has deferred anticoagulation. His last visit with them was in August 2021. He was to have followed up in 3 months. ? ?Return in about 3 months (around 08/25/2021) for Reassessment.  ? ?Loyola Mast, MD ?

## 2022-05-27 ENCOUNTER — Other Ambulatory Visit: Payer: Self-pay | Admitting: Family Medicine

## 2022-05-27 DIAGNOSIS — I10 Essential (primary) hypertension: Secondary | ICD-10-CM

## 2022-05-27 DIAGNOSIS — E782 Mixed hyperlipidemia: Secondary | ICD-10-CM

## 2022-06-29 ENCOUNTER — Other Ambulatory Visit: Payer: Self-pay | Admitting: Family Medicine

## 2022-06-29 DIAGNOSIS — E782 Mixed hyperlipidemia: Secondary | ICD-10-CM

## 2022-06-29 DIAGNOSIS — I10 Essential (primary) hypertension: Secondary | ICD-10-CM

## 2022-07-18 ENCOUNTER — Telehealth: Payer: Self-pay | Admitting: Family Medicine

## 2022-07-18 NOTE — Telephone Encounter (Signed)
Left VM to rtn call.  Needs appointment and then I can send enough to get to the appointment.  Dm/cma

## 2022-07-18 NOTE — Telephone Encounter (Signed)
Caller Name: [name] Call back phone #: [phone]  Reason for Call: Pt needs his Amlodipine refilled  he has 5 left [facility] on [address].

## 2022-07-19 NOTE — Telephone Encounter (Signed)
Left VM to rtn call.  Needs appointment and then I can send enough to get to the appointment.  Dm/cma  

## 2022-07-22 NOTE — Telephone Encounter (Signed)
Left VM to rtn call.  Needs appointment and then I can send enough to get to the appointment.  Dm/cma  

## 2022-08-10 ENCOUNTER — Other Ambulatory Visit: Payer: Self-pay | Admitting: Family Medicine

## 2022-08-10 DIAGNOSIS — I10 Essential (primary) hypertension: Secondary | ICD-10-CM

## 2022-08-12 ENCOUNTER — Other Ambulatory Visit: Payer: Self-pay | Admitting: Family Medicine

## 2022-08-12 DIAGNOSIS — E782 Mixed hyperlipidemia: Secondary | ICD-10-CM

## 2022-08-12 NOTE — Telephone Encounter (Signed)
Lft detailed VM to rtn call to schedule f/u appt Dm/cma

## 2022-08-14 ENCOUNTER — Telehealth: Payer: Self-pay | Admitting: Family Medicine

## 2022-08-14 NOTE — Telephone Encounter (Signed)
Pt has [employer] insurance for this year called to say they will be back next year.

## 2022-08-16 NOTE — Telephone Encounter (Signed)
Lft VM to rtn call to see if they will be seeing another provider for med refills.  Dm/cma

## 2022-09-07 ENCOUNTER — Other Ambulatory Visit: Payer: Self-pay | Admitting: Family Medicine

## 2022-09-07 DIAGNOSIS — I10 Essential (primary) hypertension: Secondary | ICD-10-CM

## 2022-09-09 NOTE — Telephone Encounter (Signed)
Lft VM to rtn call to schedule appt. Dm/cma  

## 2022-09-10 NOTE — Telephone Encounter (Signed)
Lft VM and sent a my chart message to advise. Dm/cma

## 2022-09-11 ENCOUNTER — Other Ambulatory Visit: Payer: Self-pay | Admitting: Family Medicine

## 2022-09-11 DIAGNOSIS — E782 Mixed hyperlipidemia: Secondary | ICD-10-CM

## 2022-09-11 NOTE — Telephone Encounter (Signed)
Lft VM to rtn call to schedule appt. Dm/cma  

## 2022-09-23 ENCOUNTER — Telehealth: Payer: Self-pay | Admitting: Family Medicine

## 2022-09-23 NOTE — Telephone Encounter (Signed)
 Noted. Dm/cma  

## 2022-09-23 NOTE — Telephone Encounter (Signed)
Pt's wife called stating she was returning a call back to [place] and also to let her know they will be moving to a different provider this year has they have a new insurance that we do not accept.

## 2022-10-05 ENCOUNTER — Other Ambulatory Visit: Payer: Self-pay | Admitting: Family Medicine

## 2022-10-05 DIAGNOSIS — I10 Essential (primary) hypertension: Secondary | ICD-10-CM

## 2022-10-17 ENCOUNTER — Other Ambulatory Visit: Payer: Self-pay | Admitting: Family Medicine

## 2022-10-17 DIAGNOSIS — E782 Mixed hyperlipidemia: Secondary | ICD-10-CM

## 2022-11-06 ENCOUNTER — Other Ambulatory Visit: Payer: Self-pay | Admitting: Family Medicine

## 2022-11-06 DIAGNOSIS — E782 Mixed hyperlipidemia: Secondary | ICD-10-CM

## 2022-11-06 DIAGNOSIS — I10 Essential (primary) hypertension: Secondary | ICD-10-CM

## 2023-02-17 ENCOUNTER — Other Ambulatory Visit: Payer: Self-pay | Admitting: Family Medicine

## 2023-02-17 DIAGNOSIS — I10 Essential (primary) hypertension: Secondary | ICD-10-CM

## 2023-02-24 ENCOUNTER — Other Ambulatory Visit: Payer: Self-pay | Admitting: Family Medicine

## 2023-02-24 DIAGNOSIS — E782 Mixed hyperlipidemia: Secondary | ICD-10-CM

## 2023-03-16 ENCOUNTER — Other Ambulatory Visit: Payer: Self-pay | Admitting: Family Medicine

## 2023-03-16 DIAGNOSIS — E782 Mixed hyperlipidemia: Secondary | ICD-10-CM

## 2023-08-16 IMAGING — CR DG FINGER THUMB 2+V*L*
3 series · 3 of 3 positions shown · non-contrast
Comparison: None.

CLINICAL DATA: Left thumb injury 1 month ago

EXAM:
LEFT THUMB 2+V

[x finger pa left]
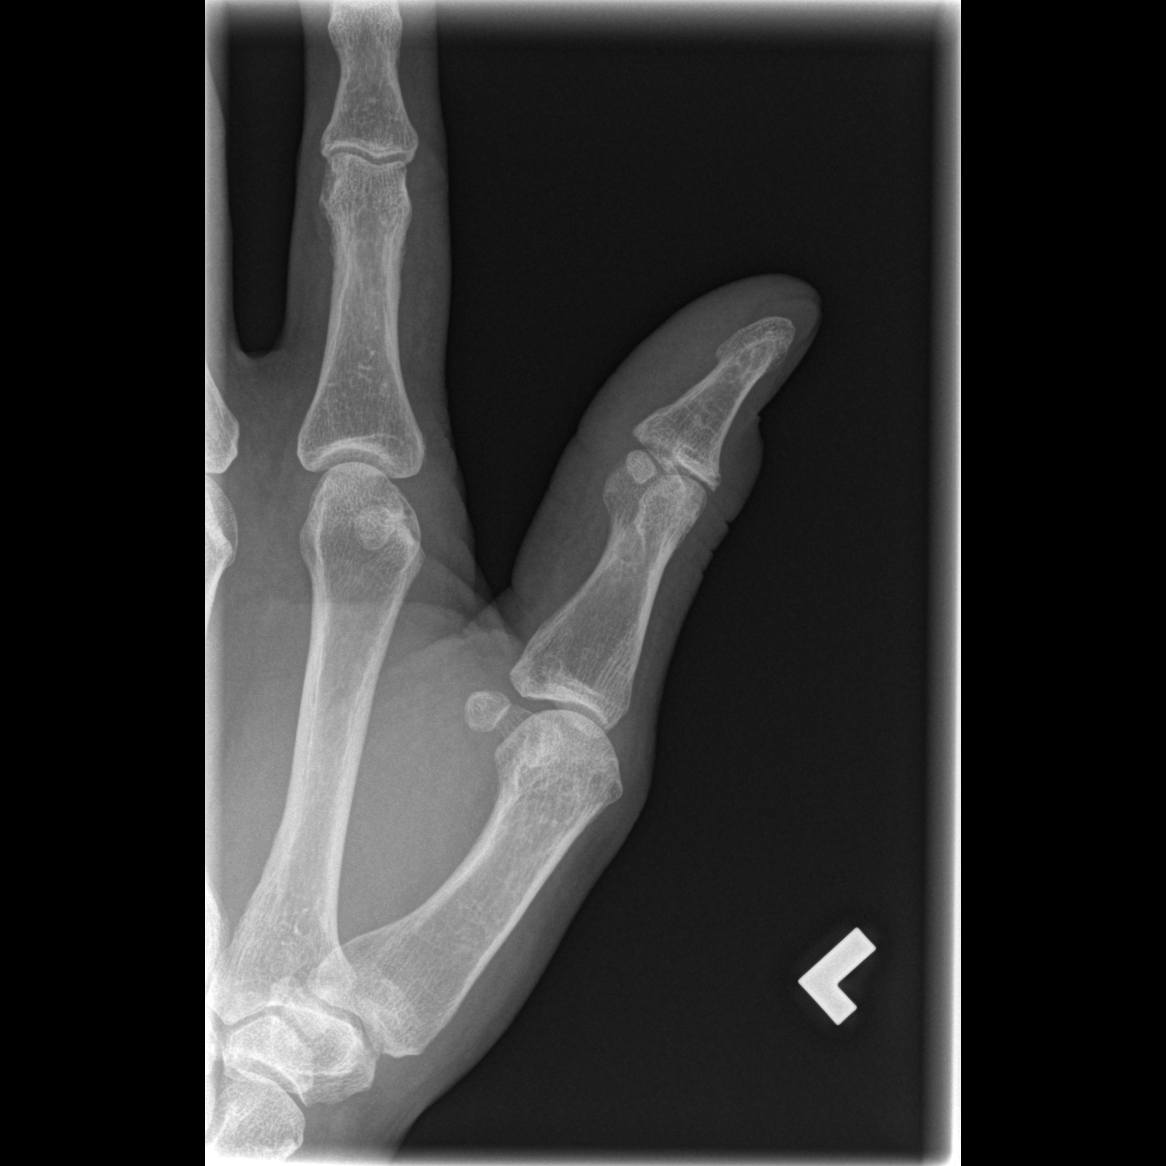

[x finger obl. left]
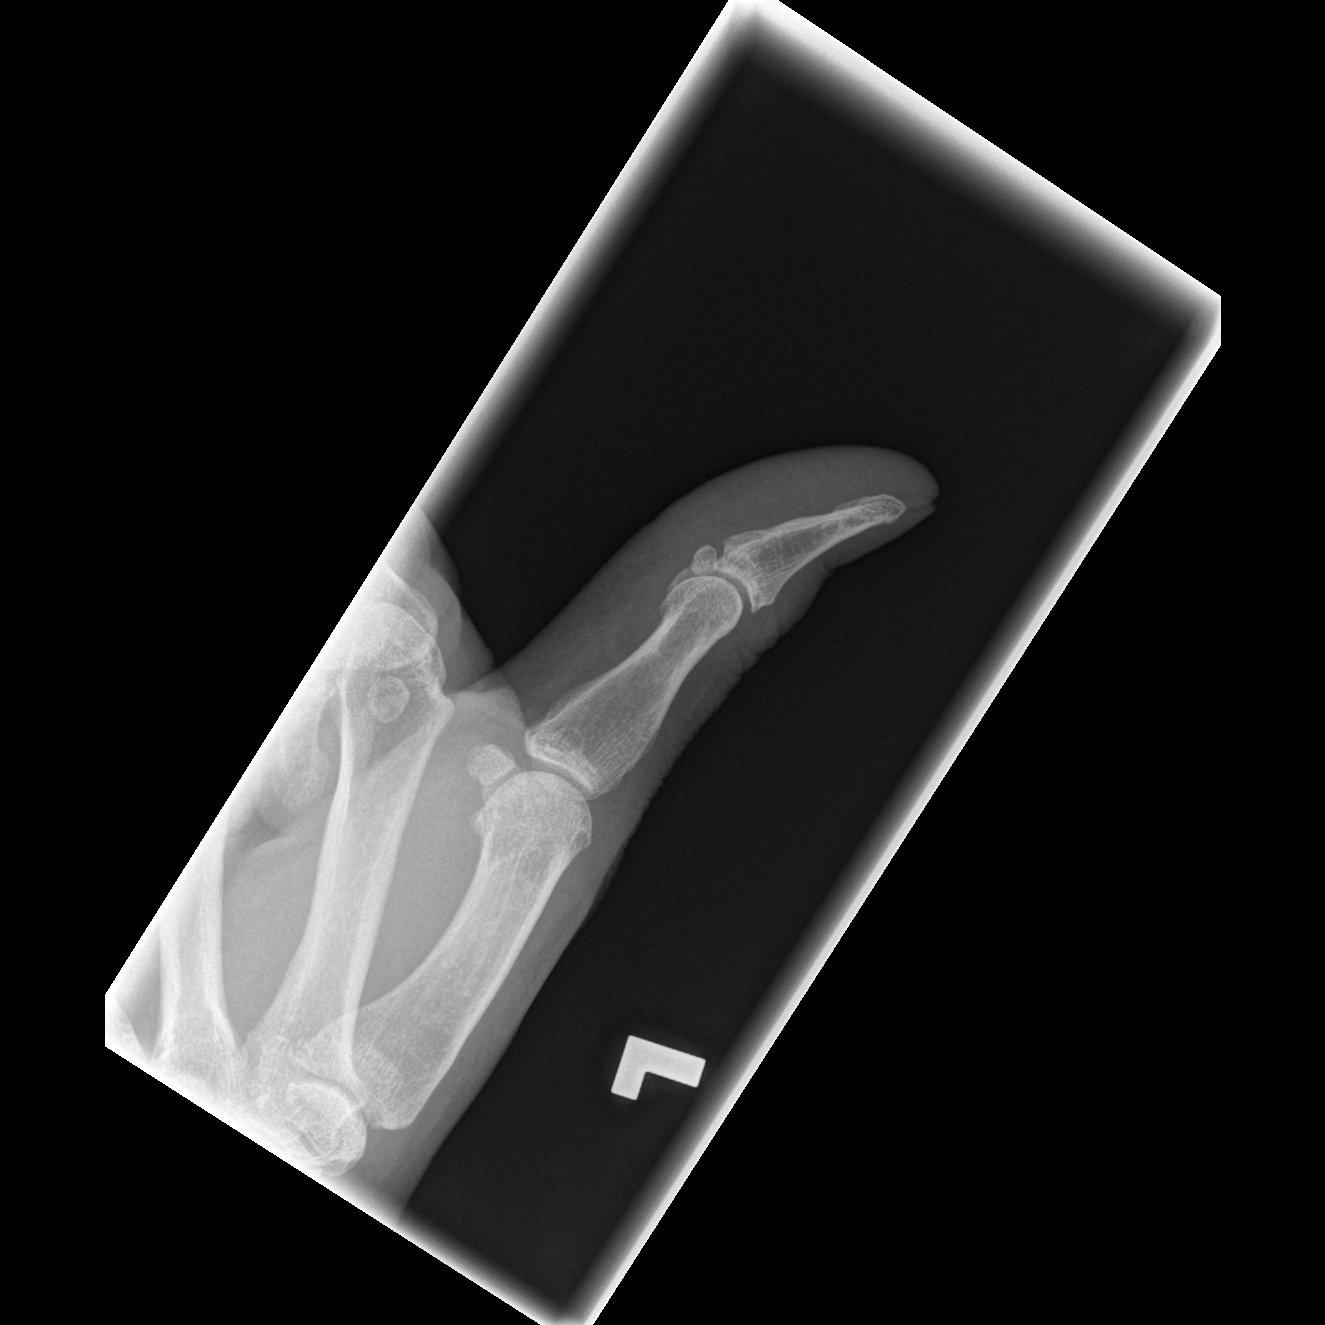

[x finger lateral left]
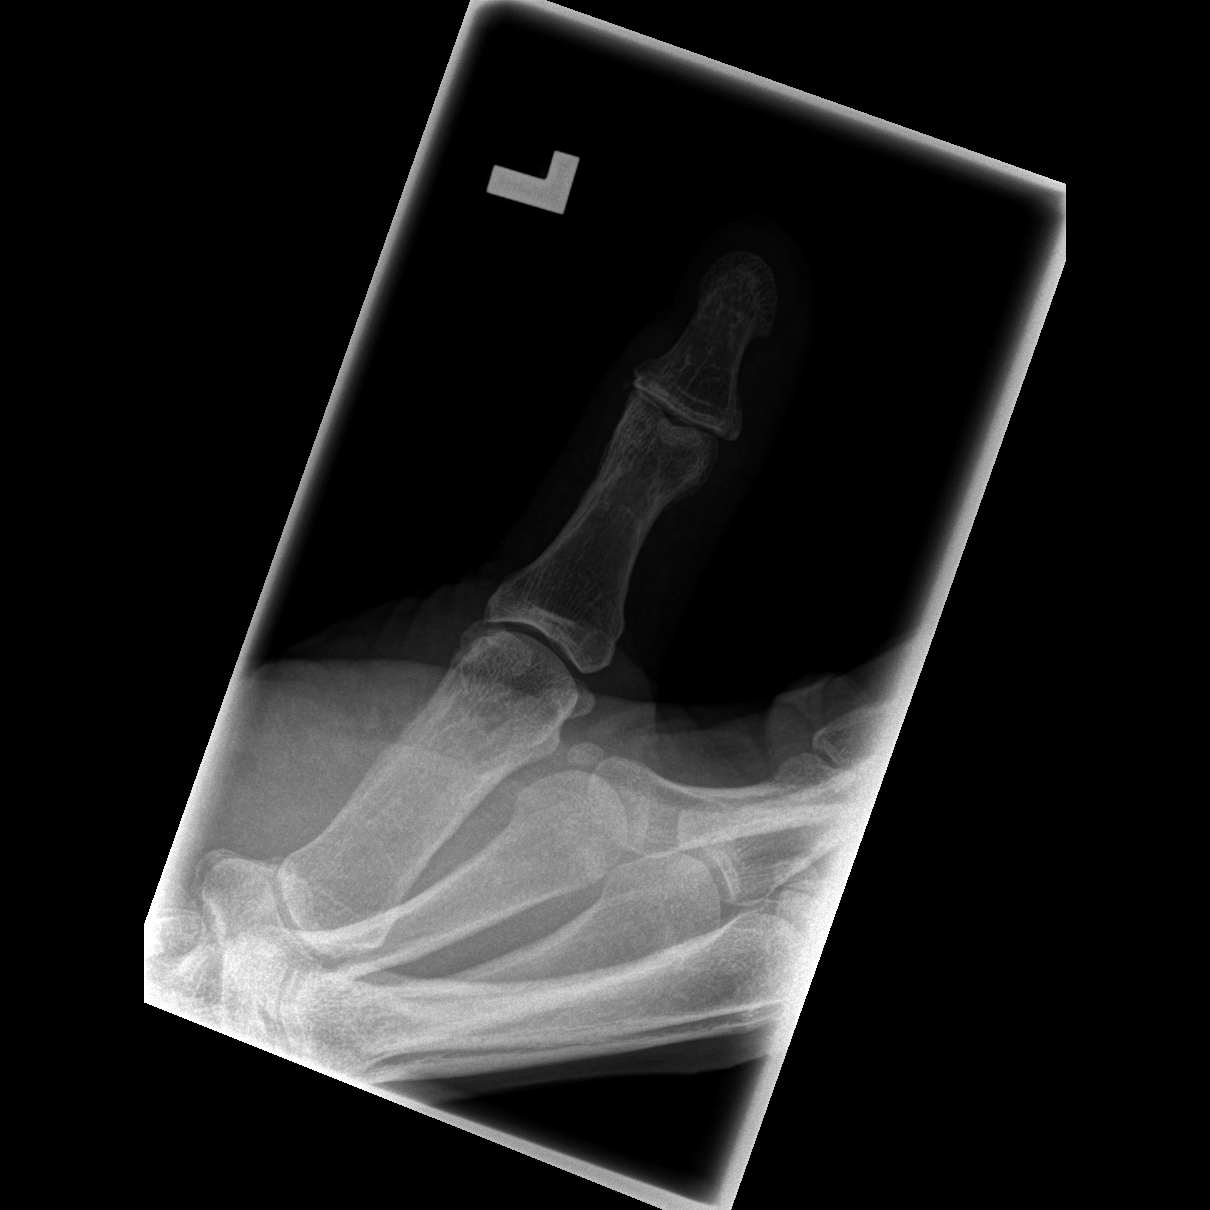

[3 of 3 positions shown; findings below may reference images not displayed]

FINDINGS: Possible nondisplaced fracture at the base of the distal phalanx
along the ulnar aspect, with subacute appearance.
IMPRESSION: Possible nondisplaced fracture at the base of the thumb distal
phalanx, with a subacute appearance.
